# Patient Record
Sex: Female | Born: 1955 | Hispanic: No | Marital: Single | Smoking: Former smoker
Health system: Southern US, Community
[De-identification: ages and names within clinical notes are randomized; demographics above are authoritative.]

## PROBLEM LIST (undated history)

## (undated) DIAGNOSIS — K760 Fatty (change of) liver, not elsewhere classified: Secondary | ICD-10-CM

## (undated) DIAGNOSIS — K5792 Diverticulitis of intestine, part unspecified, without perforation or abscess without bleeding: Secondary | ICD-10-CM

## (undated) DIAGNOSIS — I10 Essential (primary) hypertension: Secondary | ICD-10-CM

## (undated) DIAGNOSIS — IMO0002 Reserved for concepts with insufficient information to code with codable children: Secondary | ICD-10-CM

## (undated) HISTORY — PX: ABDOMINAL HYSTERECTOMY: SHX81

## (undated) HISTORY — PX: ANKLE SURGERY: SHX546

## (undated) HISTORY — PX: APPENDECTOMY: SHX54

---

## 1999-10-07 ENCOUNTER — Inpatient Hospital Stay (HOSPITAL_COMMUNITY): Admission: EM | Admit: 1999-10-07 | Discharge: 1999-10-12 | Payer: Self-pay | Admitting: Emergency Medicine

## 1999-10-07 ENCOUNTER — Encounter: Payer: Self-pay | Admitting: General Surgery

## 1999-10-07 ENCOUNTER — Encounter: Payer: Self-pay | Admitting: Emergency Medicine

## 2001-02-27 ENCOUNTER — Encounter: Admission: RE | Admit: 2001-02-27 | Discharge: 2001-02-27 | Payer: Self-pay | Admitting: Cardiology

## 2001-02-27 ENCOUNTER — Encounter: Payer: Self-pay | Admitting: Cardiology

## 2001-03-19 ENCOUNTER — Encounter: Admission: RE | Admit: 2001-03-19 | Discharge: 2001-04-01 | Payer: Self-pay | Admitting: Family Medicine

## 2002-05-28 ENCOUNTER — Ambulatory Visit (HOSPITAL_COMMUNITY): Admission: RE | Admit: 2002-05-28 | Discharge: 2002-05-28 | Payer: Self-pay | Admitting: Internal Medicine

## 2002-05-28 ENCOUNTER — Encounter: Payer: Self-pay | Admitting: Internal Medicine

## 2003-05-22 ENCOUNTER — Other Ambulatory Visit: Admission: RE | Admit: 2003-05-22 | Discharge: 2003-05-22 | Payer: Self-pay | Admitting: Obstetrics and Gynecology

## 2003-09-01 ENCOUNTER — Encounter: Admission: RE | Admit: 2003-09-01 | Discharge: 2003-09-01 | Payer: Self-pay | Admitting: General Practice

## 2003-09-01 ENCOUNTER — Encounter: Payer: Self-pay | Admitting: General Practice

## 2004-05-05 ENCOUNTER — Emergency Department (HOSPITAL_COMMUNITY): Admission: EM | Admit: 2004-05-05 | Discharge: 2004-05-05 | Payer: Self-pay

## 2004-05-12 ENCOUNTER — Ambulatory Visit (HOSPITAL_COMMUNITY): Admission: RE | Admit: 2004-05-12 | Discharge: 2004-05-12 | Payer: Self-pay | Admitting: Family Medicine

## 2004-06-11 ENCOUNTER — Ambulatory Visit (HOSPITAL_COMMUNITY): Admission: RE | Admit: 2004-06-11 | Discharge: 2004-06-11 | Payer: Self-pay | Admitting: Internal Medicine

## 2005-06-20 ENCOUNTER — Ambulatory Visit: Payer: Self-pay | Admitting: Nurse Practitioner

## 2005-06-21 ENCOUNTER — Ambulatory Visit (HOSPITAL_COMMUNITY): Admission: RE | Admit: 2005-06-21 | Discharge: 2005-06-21 | Payer: Self-pay | Admitting: Internal Medicine

## 2005-06-27 ENCOUNTER — Ambulatory Visit (HOSPITAL_COMMUNITY): Admission: RE | Admit: 2005-06-27 | Discharge: 2005-06-27 | Payer: Self-pay | Admitting: Family Medicine

## 2005-07-05 ENCOUNTER — Ambulatory Visit: Payer: Self-pay | Admitting: Nurse Practitioner

## 2005-07-06 ENCOUNTER — Ambulatory Visit: Payer: Self-pay | Admitting: *Deleted

## 2005-12-07 ENCOUNTER — Ambulatory Visit: Payer: Self-pay | Admitting: Nurse Practitioner

## 2005-12-27 ENCOUNTER — Ambulatory Visit: Payer: Self-pay | Admitting: Nurse Practitioner

## 2005-12-28 ENCOUNTER — Ambulatory Visit (HOSPITAL_COMMUNITY): Admission: RE | Admit: 2005-12-28 | Discharge: 2005-12-28 | Payer: Self-pay | Admitting: Internal Medicine

## 2006-01-04 ENCOUNTER — Ambulatory Visit: Payer: Self-pay | Admitting: Nurse Practitioner

## 2006-03-05 IMAGING — CR DG BE W/ AIR HIGH DENSITY
2 series · 2 of 2 positions shown · non-contrast
Comparison: None.

CLINICAL DATA: Rectal bleeding.
DOUBLE CONTRAST BARIUM ENEMA, 05/12/04

[view not recorded (1 of 2)]
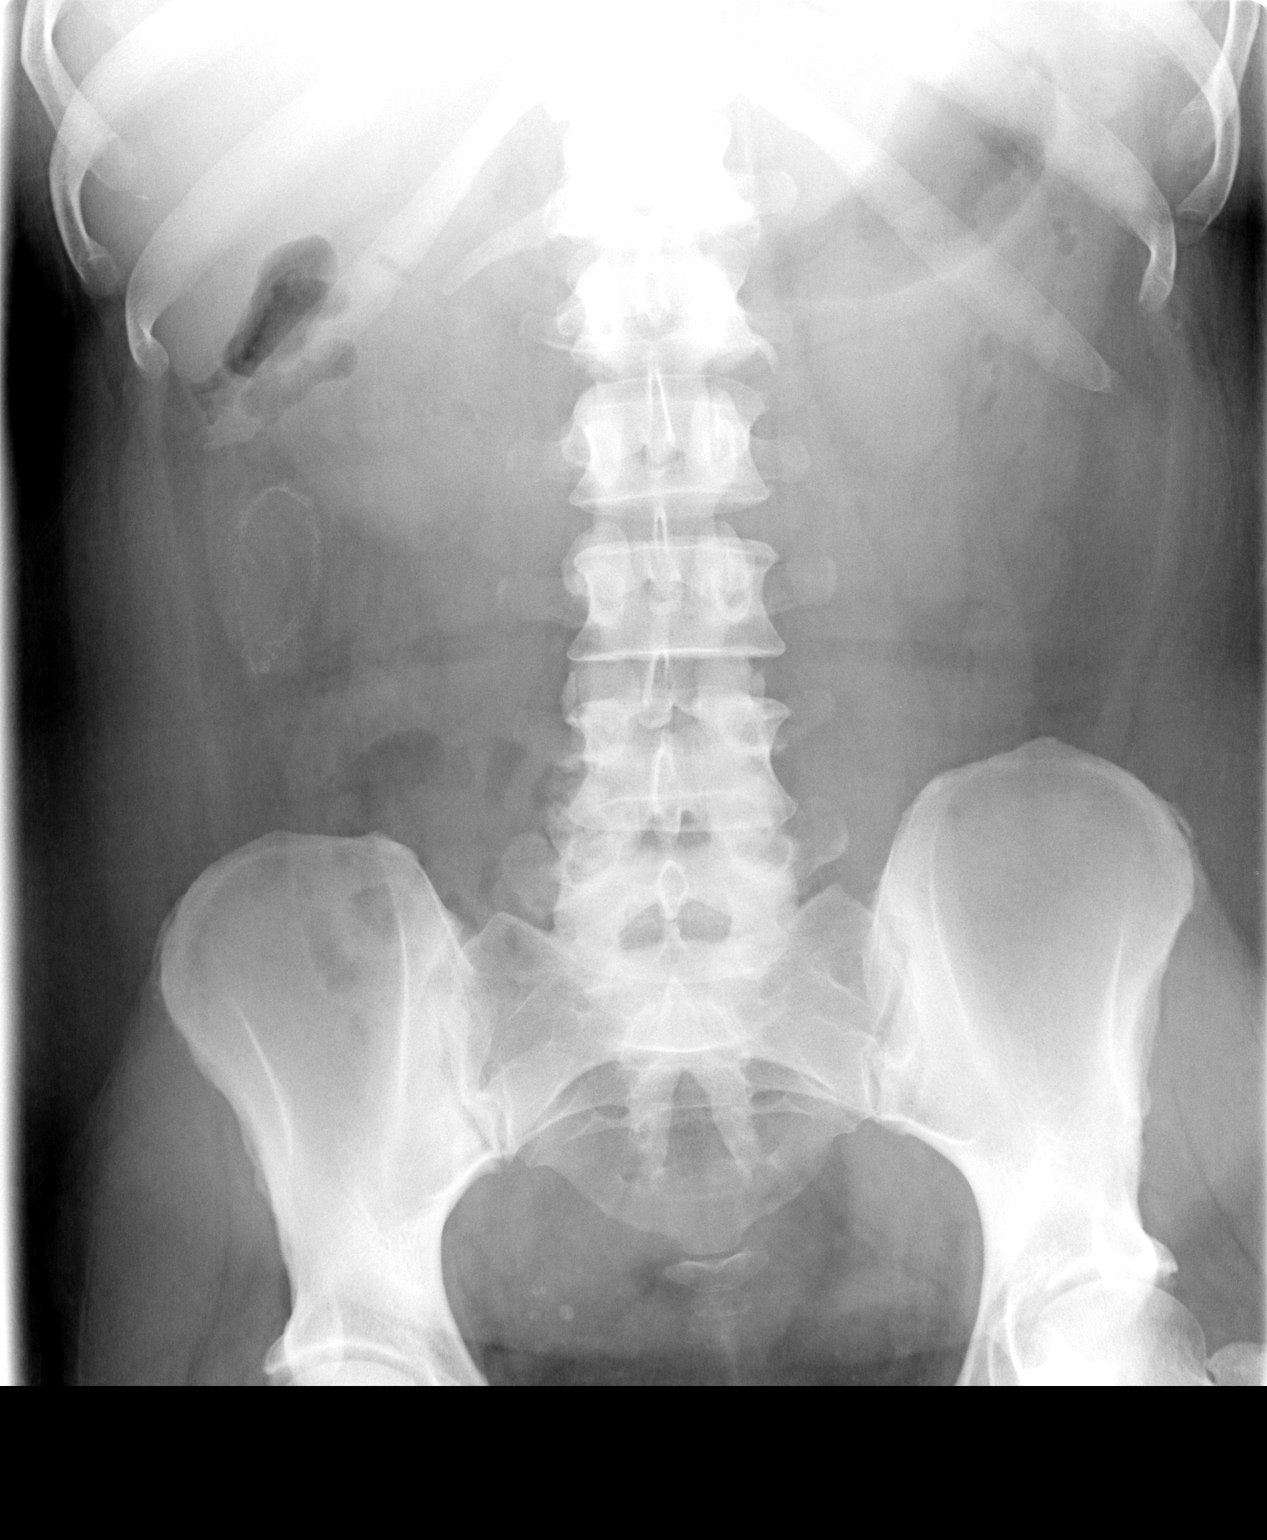

[view not recorded (2 of 2)]
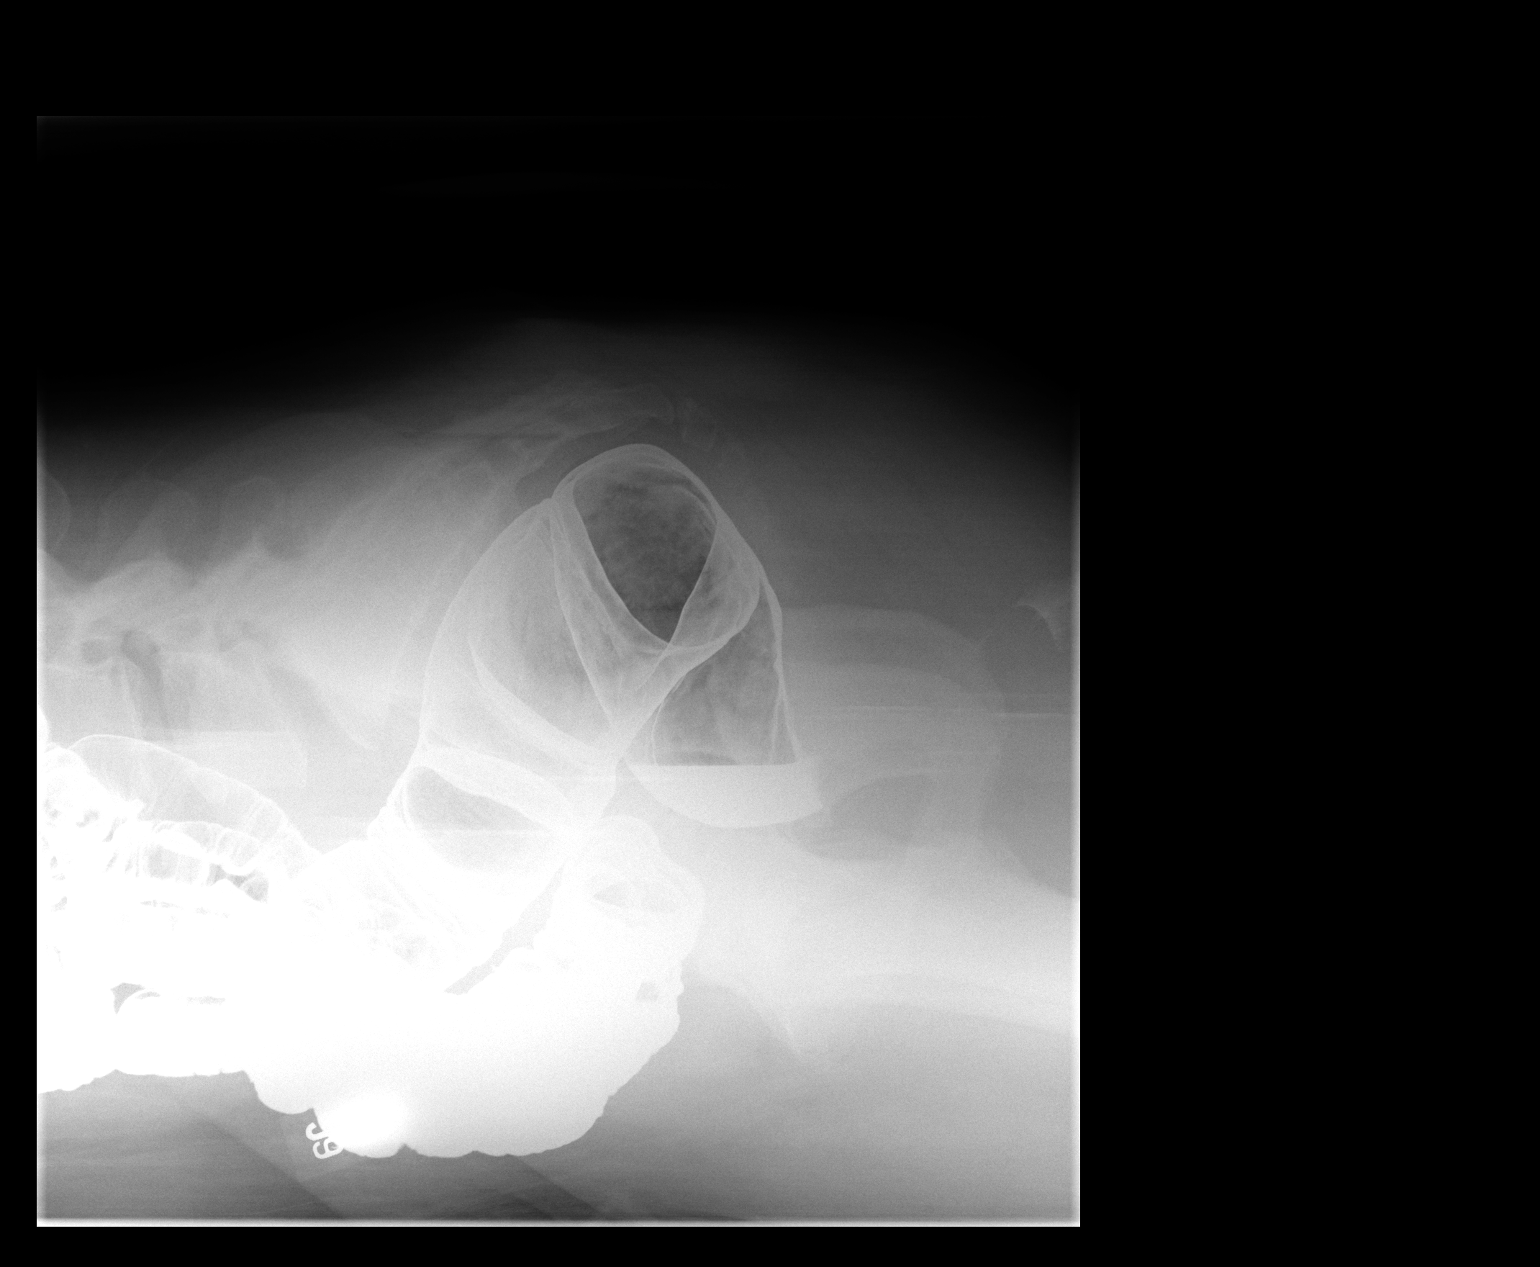

[2 of 2 positions shown; findings below may reference images not displayed]

FINDINGS: Pre-procedure KUB demonstrates minimal bowel gas.  There is a surgical suture line in the right abdomen suggesting bowel anastomosis.  Patient reports only a history of appendectomy, tubal ligation, and hysterectomy.
FINDINGS: Double contrast barium enema was performed.  There was brisk reflux of air and barium into the distal ileum via the right upper quadrant ileocolic anastomosis.  Presumably, the patient?s cecum was removed at the time of appendectomy.  Given the reflux of air into the small bowel, colonic distention was limited.  Additionally, with reflux into the small bowel, the patient became uncomfortable.  Using multiple positions, including prone and supine as well as upright, nearly the entire colon was able to be visualized.  There is no evidence for significant colonic polyp or mass.  No colonic stricture is identified.  A short segment of the distal transverse colon could not be evaluated in double contrast projection secondary to bowel position.  Additionally, there is superimposition in the region of the ileocolic anastomosis and the colon at the level of the anastomosis cannot be well distended.
Diverticular change is seen scattered throughout the course of the colon from the ileocolic anastomosis to the rectum.
IMPRESSION 
Limited study without evidence for significant colonic polyps or masses within the visualized colon.  The distal ascending colon at the level of the ileocolic anastomosis, short segment of the distal transverse colon, and a short segment in the region of the distal descending colon cannot be assessed on double contrast views despite multiple different positions.  
If the patient?s symptoms persist or worsen, colonoscopy may prove helpful to further characterize.

## 2006-04-24 ENCOUNTER — Ambulatory Visit: Payer: Self-pay | Admitting: Nurse Practitioner

## 2006-07-08 ENCOUNTER — Emergency Department (HOSPITAL_COMMUNITY): Admission: EM | Admit: 2006-07-08 | Discharge: 2006-07-08 | Payer: Self-pay | Admitting: *Deleted

## 2006-07-10 ENCOUNTER — Ambulatory Visit: Payer: Self-pay | Admitting: Nurse Practitioner

## 2006-07-27 ENCOUNTER — Ambulatory Visit: Payer: Self-pay | Admitting: Nurse Practitioner

## 2006-07-30 ENCOUNTER — Ambulatory Visit: Payer: Self-pay | Admitting: Internal Medicine

## 2006-07-30 ENCOUNTER — Ambulatory Visit (HOSPITAL_COMMUNITY): Admission: RE | Admit: 2006-07-30 | Discharge: 2006-07-30 | Payer: Self-pay | Admitting: Nurse Practitioner

## 2007-07-04 ENCOUNTER — Ambulatory Visit: Payer: Self-pay | Admitting: Internal Medicine

## 2007-08-21 ENCOUNTER — Encounter (INDEPENDENT_AMBULATORY_CARE_PROVIDER_SITE_OTHER): Payer: Self-pay | Admitting: *Deleted

## 2007-10-15 ENCOUNTER — Emergency Department (HOSPITAL_COMMUNITY): Admission: EM | Admit: 2007-10-15 | Discharge: 2007-10-15 | Payer: Self-pay | Admitting: *Deleted

## 2007-10-23 ENCOUNTER — Ambulatory Visit: Payer: Self-pay | Admitting: Family Medicine

## 2007-10-23 ENCOUNTER — Encounter (INDEPENDENT_AMBULATORY_CARE_PROVIDER_SITE_OTHER): Payer: Self-pay | Admitting: Nurse Practitioner

## 2007-10-23 LAB — CONVERTED CEMR LAB
Basophils Absolute: 0 10*3/uL (ref 0.0–0.1)
Eosinophils Relative: 5 % (ref 0–5)
Hemoglobin: 12.7 g/dL (ref 12.0–15.0)
Hep A Total Ab: NEGATIVE
Hep B Core Total Ab: NEGATIVE
Hep B E Ab: NEGATIVE
Monocytes Relative: 10 % (ref 3–12)
Neutro Abs: 6 10*3/uL (ref 1.7–7.7)
Neutrophils Relative %: 66 % (ref 43–77)
Platelets: 143 10*3/uL — ABNORMAL LOW (ref 150–400)
RBC: 3.82 M/uL — ABNORMAL LOW (ref 3.87–5.11)
Sed Rate: 34 mm/hr — ABNORMAL HIGH (ref 0–22)
Uric Acid, Serum: 5.2 mg/dL (ref 2.4–7.0)
WBC: 9.1 10*3/uL (ref 4.0–10.5)

## 2007-10-25 ENCOUNTER — Ambulatory Visit (HOSPITAL_COMMUNITY): Admission: RE | Admit: 2007-10-25 | Discharge: 2007-10-25 | Payer: Self-pay | Admitting: Family Medicine

## 2007-10-29 ENCOUNTER — Ambulatory Visit: Payer: Self-pay | Admitting: Family Medicine

## 2007-10-29 ENCOUNTER — Encounter (INDEPENDENT_AMBULATORY_CARE_PROVIDER_SITE_OTHER): Payer: Self-pay | Admitting: Nurse Practitioner

## 2007-10-29 LAB — CONVERTED CEMR LAB
LDL Cholesterol: 113 mg/dL — ABNORMAL HIGH (ref 0–99)
Triglycerides: 72 mg/dL (ref <150)
VLDL: 14 mg/dL (ref 0–40)

## 2007-11-07 ENCOUNTER — Ambulatory Visit: Payer: Self-pay | Admitting: Family Medicine

## 2007-12-03 ENCOUNTER — Ambulatory Visit (HOSPITAL_COMMUNITY): Admission: RE | Admit: 2007-12-03 | Discharge: 2007-12-03 | Payer: Self-pay | Admitting: Family Medicine

## 2008-02-20 ENCOUNTER — Ambulatory Visit: Payer: Self-pay | Admitting: Internal Medicine

## 2008-05-04 ENCOUNTER — Emergency Department (HOSPITAL_COMMUNITY): Admission: EM | Admit: 2008-05-04 | Discharge: 2008-05-04 | Payer: Self-pay | Admitting: Emergency Medicine

## 2008-08-05 ENCOUNTER — Emergency Department (HOSPITAL_COMMUNITY): Admission: EM | Admit: 2008-08-05 | Discharge: 2008-08-05 | Payer: Self-pay | Admitting: Emergency Medicine

## 2008-08-21 ENCOUNTER — Ambulatory Visit: Payer: Self-pay | Admitting: Internal Medicine

## 2008-10-13 ENCOUNTER — Emergency Department (HOSPITAL_COMMUNITY): Admission: EM | Admit: 2008-10-13 | Discharge: 2008-10-13 | Payer: Self-pay | Admitting: Emergency Medicine

## 2009-04-09 ENCOUNTER — Ambulatory Visit: Payer: Self-pay | Admitting: Family Medicine

## 2009-04-16 ENCOUNTER — Ambulatory Visit: Payer: Self-pay | Admitting: Internal Medicine

## 2009-04-21 ENCOUNTER — Emergency Department (HOSPITAL_COMMUNITY): Admission: EM | Admit: 2009-04-21 | Discharge: 2009-04-22 | Payer: Self-pay | Admitting: Emergency Medicine

## 2009-04-22 ENCOUNTER — Ambulatory Visit: Payer: Self-pay | Admitting: Family Medicine

## 2009-07-07 ENCOUNTER — Ambulatory Visit: Payer: Self-pay | Admitting: Internal Medicine

## 2009-08-11 ENCOUNTER — Ambulatory Visit: Payer: Self-pay | Admitting: Adult Health

## 2009-08-12 ENCOUNTER — Encounter (INDEPENDENT_AMBULATORY_CARE_PROVIDER_SITE_OTHER): Payer: Self-pay | Admitting: Nurse Practitioner

## 2009-08-13 ENCOUNTER — Ambulatory Visit (HOSPITAL_COMMUNITY): Admission: RE | Admit: 2009-08-13 | Discharge: 2009-08-13 | Payer: Self-pay | Admitting: Internal Medicine

## 2009-08-17 IMAGING — CR DG ANKLE COMPLETE 3+V*L*
3 series · 3 of 3 positions shown · non-contrast
Comparison: none

CLINICAL DATA: Left ankle pain. Previous injury.
 LEFT ANKLE ?3 VIEW:

[t ankle joint ap left]
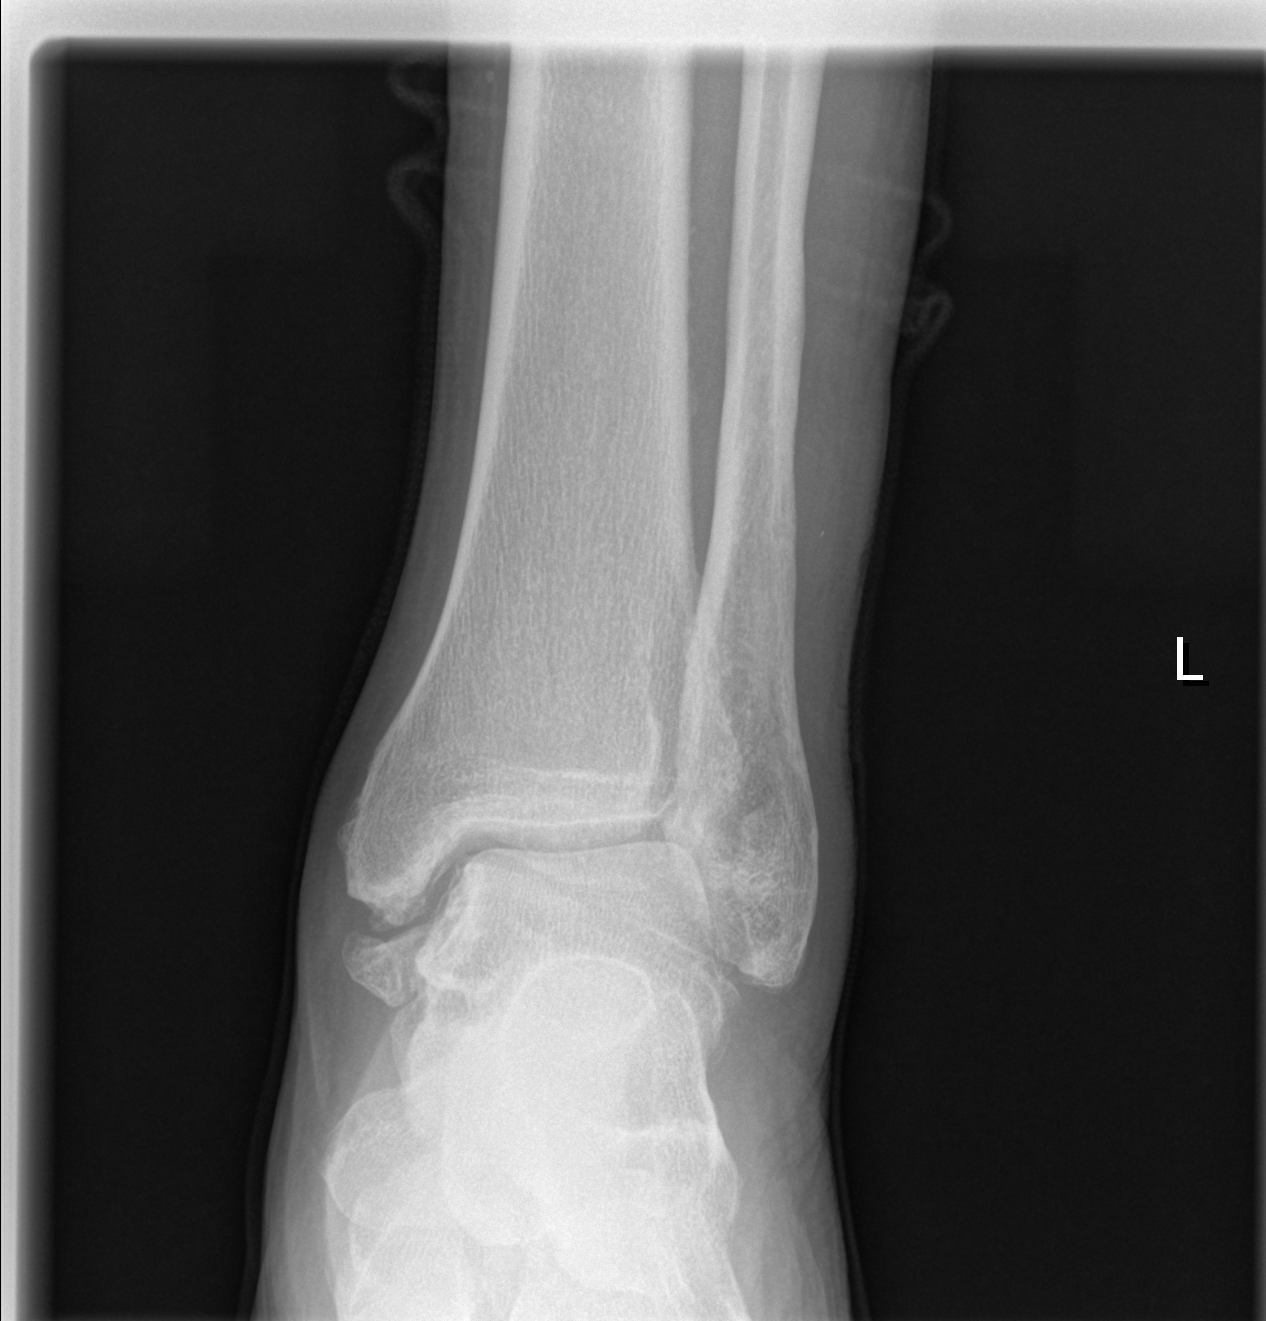

[t ankle joint oblique left]
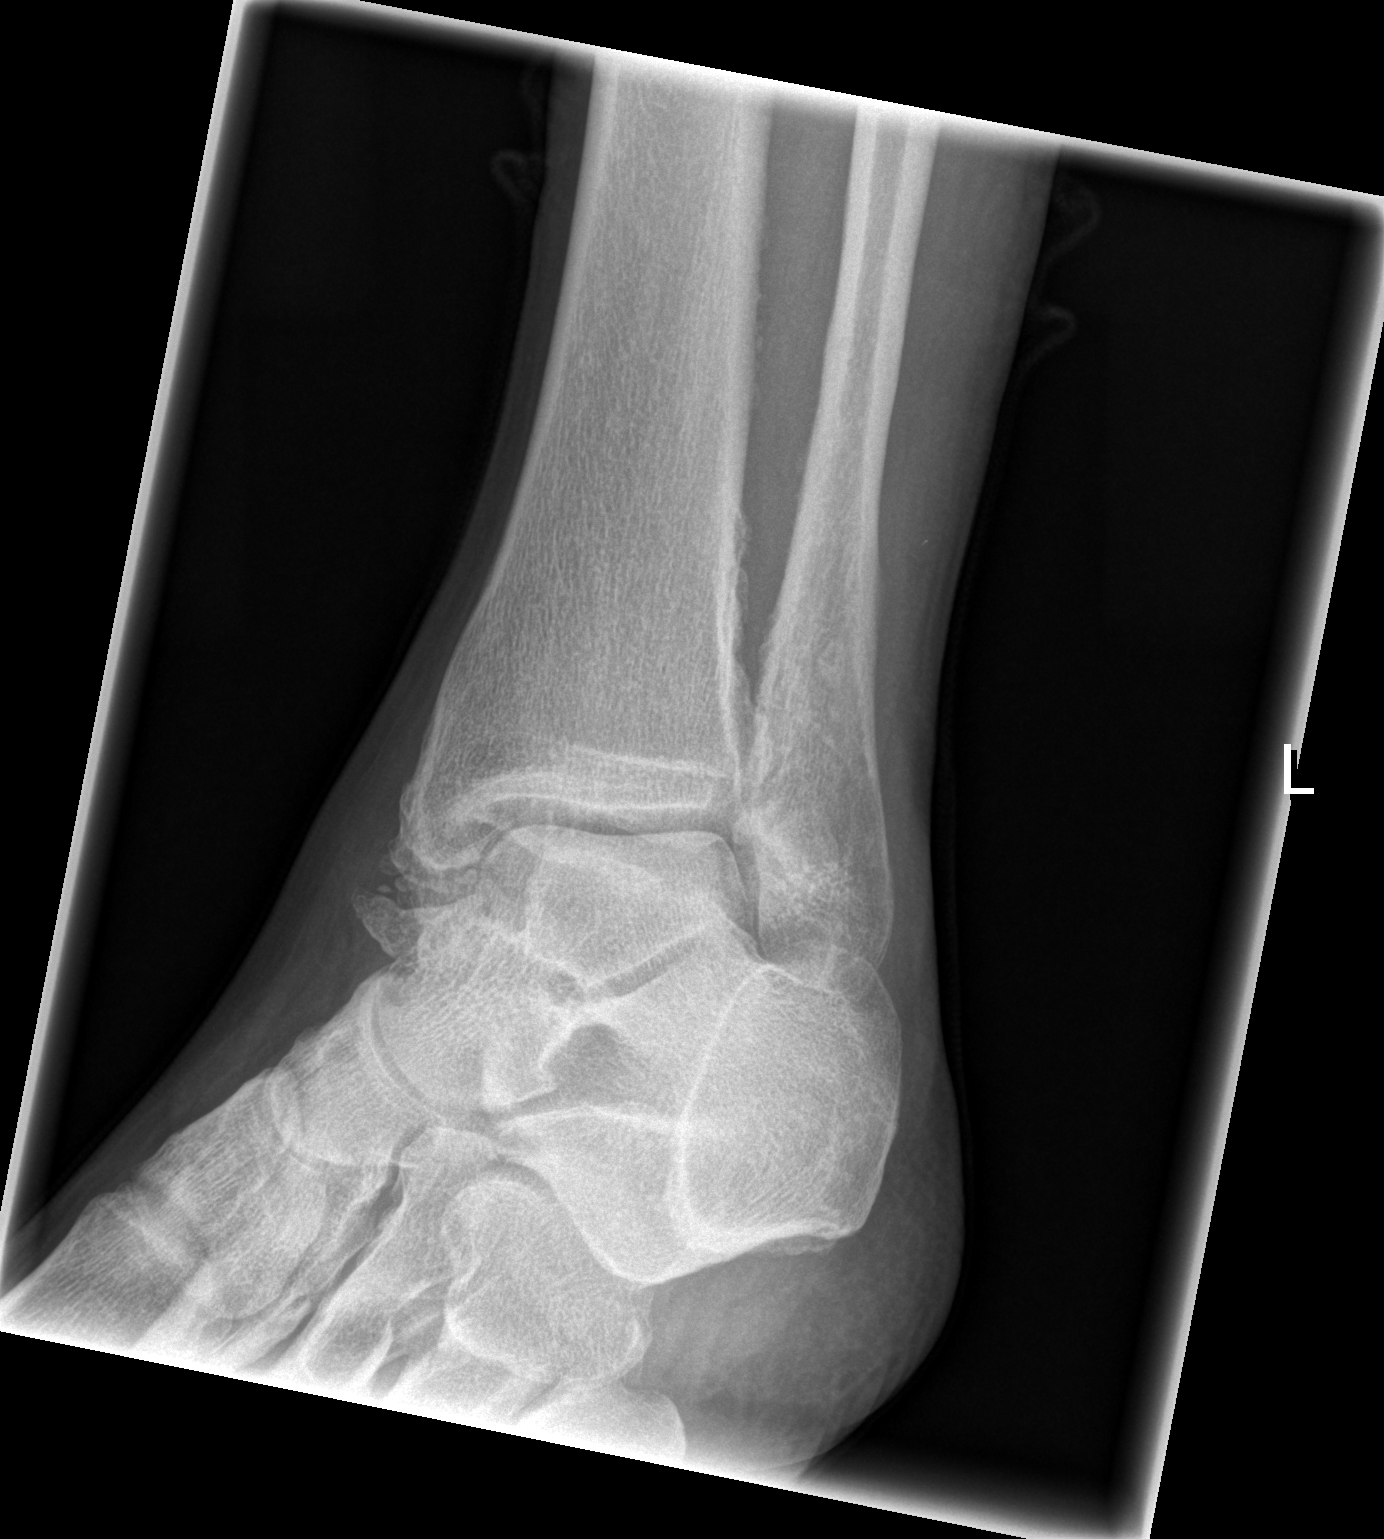

[t ankle joint lat left]
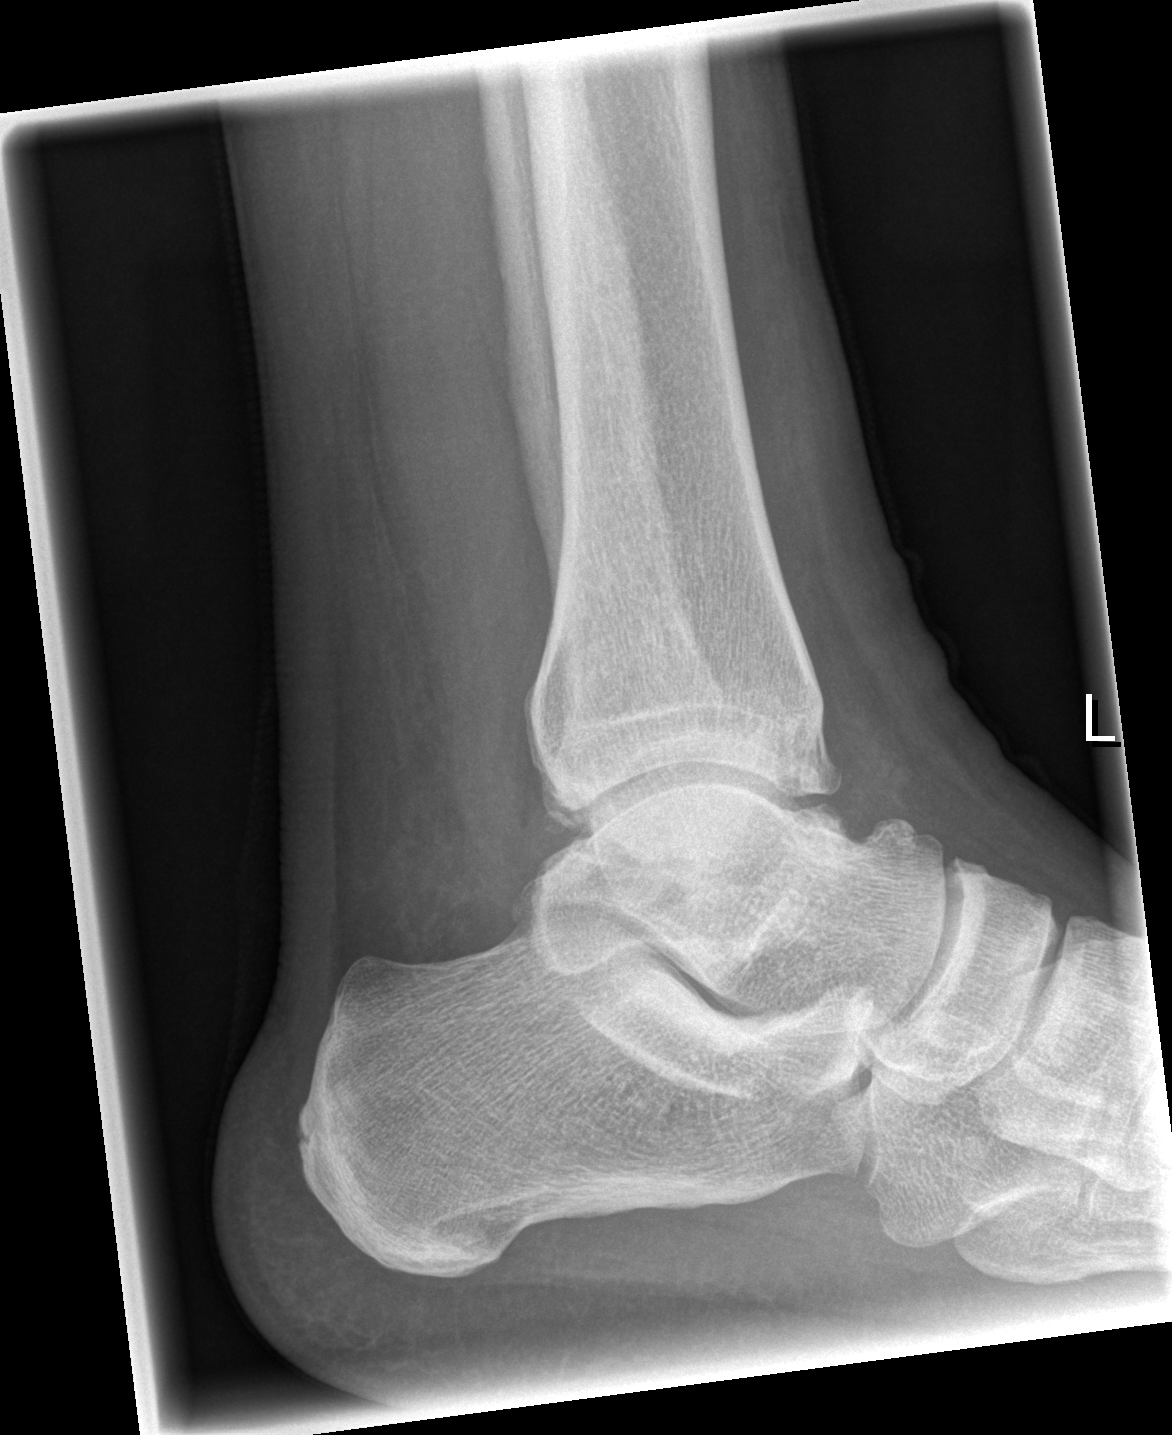

[3 of 3 positions shown; findings below may reference images not displayed]

FINDINGS: There is degenerative arthritis at the ankle joint with tibial and talar osteophytes.  Joint space is not narrowed.  There is an old medial malleolar fracture with some ossification in the region of the deltoid ligament.  I wonder if there could be calcaneonavicular tarsal coalition.  No acute finding is seen.
IMPRESSION: Evidence      of an old medial malleolar fracture/deltoid ligament injury.
  Degenerative      changes at the ankle joint.
  Possible      calcaneonavicular coalition.

## 2009-09-30 ENCOUNTER — Ambulatory Visit: Payer: Self-pay | Admitting: Family Medicine

## 2009-09-30 ENCOUNTER — Encounter (INDEPENDENT_AMBULATORY_CARE_PROVIDER_SITE_OTHER): Payer: Self-pay | Admitting: Internal Medicine

## 2009-11-03 ENCOUNTER — Telehealth (INDEPENDENT_AMBULATORY_CARE_PROVIDER_SITE_OTHER): Payer: Self-pay | Admitting: *Deleted

## 2009-11-03 ENCOUNTER — Emergency Department (HOSPITAL_COMMUNITY): Admission: EM | Admit: 2009-11-03 | Discharge: 2009-11-03 | Payer: Self-pay | Admitting: Family Medicine

## 2009-11-05 ENCOUNTER — Ambulatory Visit: Payer: Self-pay | Admitting: Internal Medicine

## 2010-01-05 ENCOUNTER — Encounter: Admission: RE | Admit: 2010-01-05 | Discharge: 2010-01-05 | Payer: Self-pay | Admitting: Sports Medicine

## 2010-01-14 ENCOUNTER — Inpatient Hospital Stay (HOSPITAL_COMMUNITY): Admission: EM | Admit: 2010-01-14 | Discharge: 2010-01-15 | Payer: Self-pay | Admitting: Emergency Medicine

## 2010-01-25 ENCOUNTER — Ambulatory Visit: Payer: Self-pay | Admitting: Thoracic Surgery

## 2010-02-09 ENCOUNTER — Ambulatory Visit: Payer: Self-pay | Admitting: Internal Medicine

## 2010-02-10 ENCOUNTER — Encounter (INDEPENDENT_AMBULATORY_CARE_PROVIDER_SITE_OTHER): Payer: Self-pay | Admitting: Adult Health

## 2010-02-15 ENCOUNTER — Ambulatory Visit (HOSPITAL_COMMUNITY): Admission: RE | Admit: 2010-02-15 | Discharge: 2010-02-15 | Payer: Self-pay | Admitting: Internal Medicine

## 2010-03-21 ENCOUNTER — Ambulatory Visit: Payer: Self-pay | Admitting: Family Medicine

## 2010-03-21 ENCOUNTER — Encounter (INDEPENDENT_AMBULATORY_CARE_PROVIDER_SITE_OTHER): Payer: Self-pay | Admitting: Adult Health

## 2010-03-21 LAB — CONVERTED CEMR LAB: Microalb, Ur: 0.51 mg/dL (ref 0.00–1.89)

## 2010-06-29 ENCOUNTER — Ambulatory Visit: Payer: Self-pay | Admitting: Internal Medicine

## 2010-06-29 ENCOUNTER — Encounter (INDEPENDENT_AMBULATORY_CARE_PROVIDER_SITE_OTHER): Payer: Self-pay | Admitting: Adult Health

## 2010-06-29 LAB — CONVERTED CEMR LAB
ALT: 97 units/L — ABNORMAL HIGH (ref 0–35)
AST: 127 units/L — ABNORMAL HIGH (ref 0–37)
BUN: 13 mg/dL (ref 6–23)
Basophils Absolute: 0.1 10*3/uL (ref 0.0–0.1)
Basophils Relative: 1 % (ref 0–1)
CO2: 19 meq/L (ref 19–32)
Glucose, Bld: 77 mg/dL (ref 70–99)
Lymphs Abs: 2.4 10*3/uL (ref 0.7–4.0)
MCV: 92 fL (ref 78.0–100.0)
Monocytes Relative: 7 % (ref 3–12)
Neutro Abs: 2 10*3/uL (ref 1.7–7.7)
Neutrophils Relative %: 40 % — ABNORMAL LOW (ref 43–77)
Platelets: 217 10*3/uL (ref 150–400)
Potassium: 3.9 meq/L (ref 3.5–5.3)
RBC: 4.4 M/uL (ref 3.87–5.11)
RDW: 15.2 % (ref 11.5–15.5)
Total Protein: 8 g/dL (ref 6.0–8.3)

## 2010-06-30 ENCOUNTER — Encounter (INDEPENDENT_AMBULATORY_CARE_PROVIDER_SITE_OTHER): Payer: Self-pay | Admitting: Adult Health

## 2010-06-30 LAB — CONVERTED CEMR LAB
HCV Ab: NEGATIVE
Hep B Core Total Ab: NEGATIVE

## 2010-08-02 ENCOUNTER — Ambulatory Visit: Payer: Self-pay | Admitting: Internal Medicine

## 2010-08-02 ENCOUNTER — Encounter (INDEPENDENT_AMBULATORY_CARE_PROVIDER_SITE_OTHER): Payer: Self-pay | Admitting: Family Medicine

## 2010-08-02 LAB — CONVERTED CEMR LAB
AST: 70 units/L — ABNORMAL HIGH (ref 0–37)
Albumin: 4.4 g/dL (ref 3.5–5.2)
Bilirubin, Direct: 0.1 mg/dL (ref 0.0–0.3)
HDL: 40 mg/dL (ref >39)
LDL Cholesterol: 141 mg/dL — ABNORMAL HIGH (ref 0–99)
Total Bilirubin: 0.6 mg/dL (ref 0.3–1.2)
Total CHOL/HDL Ratio: 5.2

## 2010-11-16 ENCOUNTER — Encounter (INDEPENDENT_AMBULATORY_CARE_PROVIDER_SITE_OTHER): Payer: Self-pay | Admitting: *Deleted

## 2010-11-16 LAB — CONVERTED CEMR LAB
ALT: 60 units/L — ABNORMAL HIGH (ref 0–35)
Albumin: 4.7 g/dL (ref 3.5–5.2)
CO2: 28 meq/L (ref 19–32)
Calcium: 10.2 mg/dL (ref 8.4–10.5)
Chloride: 99 meq/L (ref 96–112)
Glucose, Bld: 102 mg/dL — ABNORMAL HIGH (ref 70–99)
Potassium: 4.5 meq/L (ref 3.5–5.3)
Sodium: 137 meq/L (ref 135–145)
Total Protein: 8.3 g/dL (ref 6.0–8.3)

## 2010-12-02 ENCOUNTER — Ambulatory Visit (HOSPITAL_COMMUNITY)
Admission: RE | Admit: 2010-12-02 | Discharge: 2010-12-02 | Payer: Self-pay | Source: Home / Self Care | Attending: Family Medicine | Admitting: Family Medicine

## 2010-12-25 ENCOUNTER — Encounter: Payer: Self-pay | Admitting: Family Medicine

## 2011-01-05 ENCOUNTER — Emergency Department (HOSPITAL_COMMUNITY)
Admission: EM | Admit: 2011-01-05 | Discharge: 2011-01-05 | Disposition: A | Discharge status: Home / Self Care | Payer: Medicare Other | Attending: Emergency Medicine | Admitting: Emergency Medicine

## 2011-01-05 DIAGNOSIS — Z79899 Other long term (current) drug therapy: Secondary | ICD-10-CM | POA: Insufficient documentation

## 2011-01-05 DIAGNOSIS — IMO0002 Reserved for concepts with insufficient information to code with codable children: Secondary | ICD-10-CM | POA: Insufficient documentation

## 2011-01-05 DIAGNOSIS — F3289 Other specified depressive episodes: Secondary | ICD-10-CM | POA: Insufficient documentation

## 2011-01-05 DIAGNOSIS — F329 Major depressive disorder, single episode, unspecified: Secondary | ICD-10-CM | POA: Insufficient documentation

## 2011-01-05 DIAGNOSIS — I10 Essential (primary) hypertension: Secondary | ICD-10-CM | POA: Insufficient documentation

## 2011-01-05 DIAGNOSIS — F17200 Nicotine dependence, unspecified, uncomplicated: Secondary | ICD-10-CM | POA: Insufficient documentation

## 2011-01-05 DIAGNOSIS — K219 Gastro-esophageal reflux disease without esophagitis: Secondary | ICD-10-CM | POA: Insufficient documentation

## 2011-01-24 ENCOUNTER — Other Ambulatory Visit (HOSPITAL_COMMUNITY): Payer: Self-pay | Admitting: Family Medicine

## 2011-01-24 DIAGNOSIS — Z1231 Encounter for screening mammogram for malignant neoplasm of breast: Secondary | ICD-10-CM

## 2011-02-13 IMAGING — CR DG CHEST 1V PORT
1 series · 1 of 1 positions shown · non-contrast
Comparison: 10/13/2008

CLINICAL DATA: Chest pain and shortness of breath.

PORTABLE CHEST - 1 VIEW

[AP]
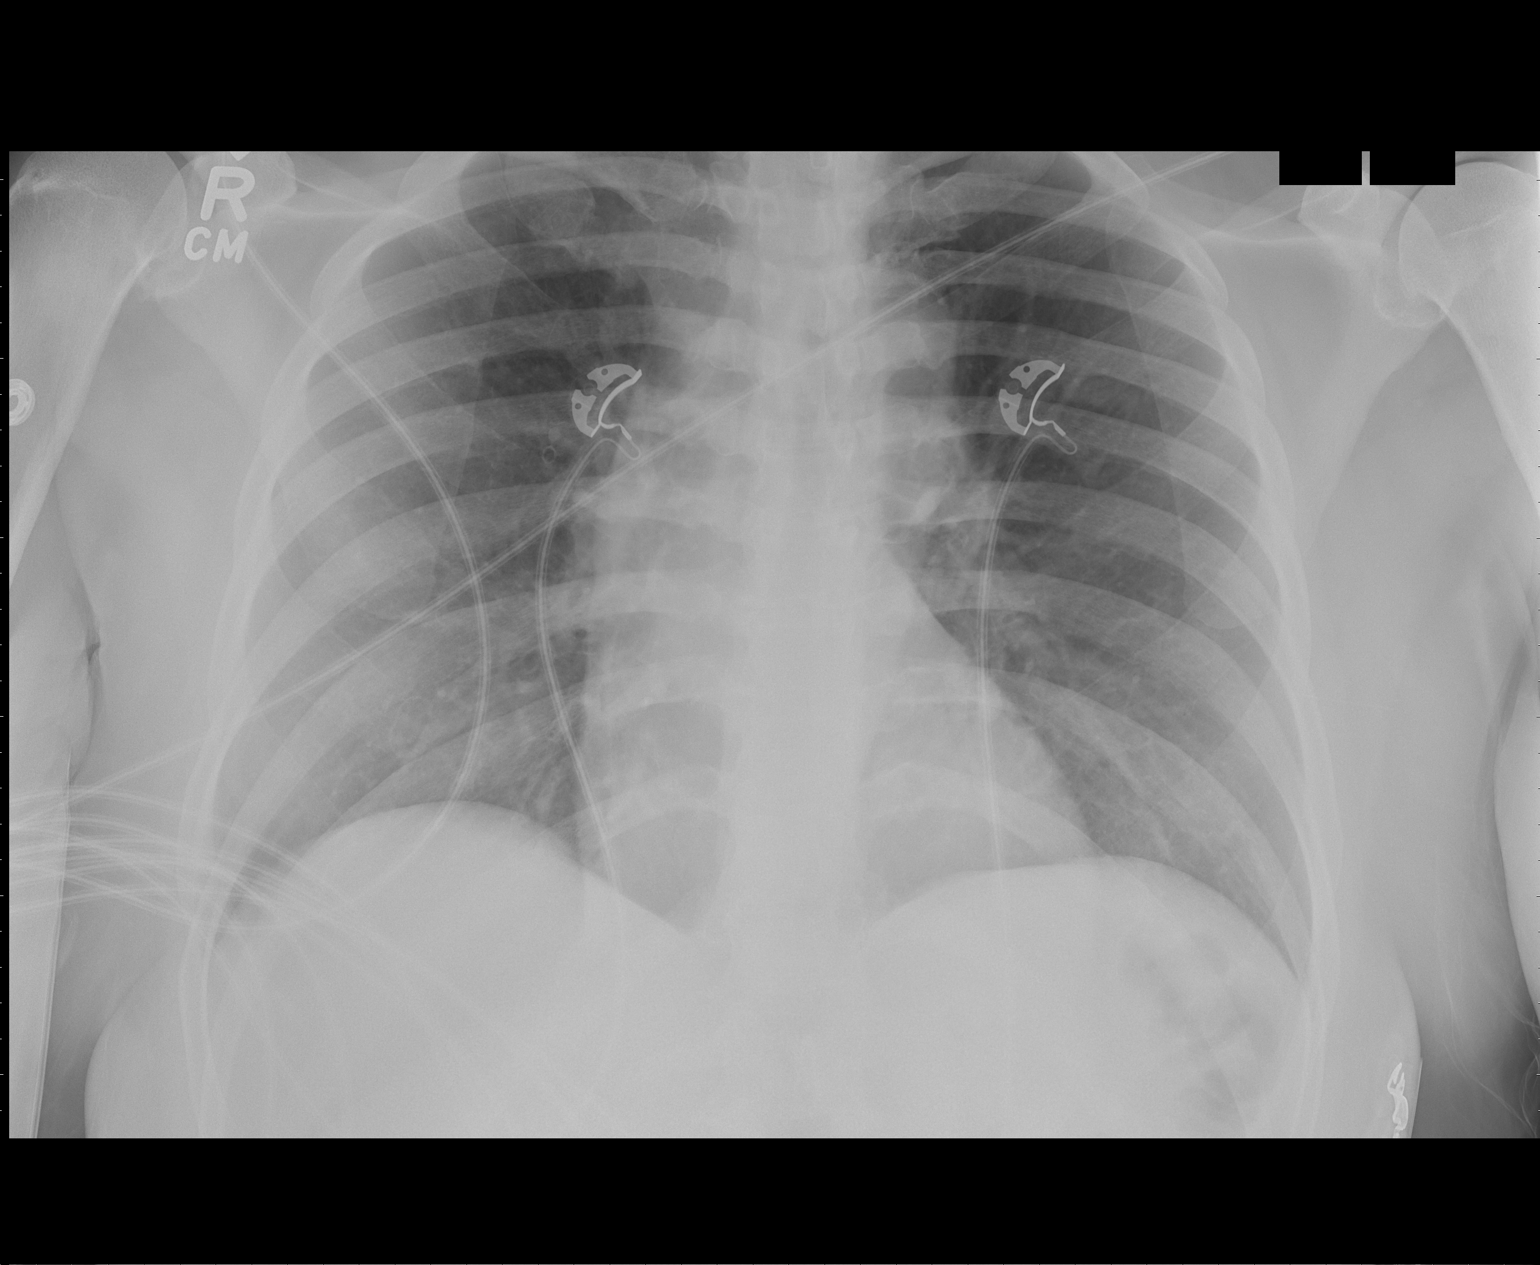

[1 of 1 positions shown; findings below may reference images not displayed]

FINDINGS: The heart size is stable and within normal limits.  No
edema or infiltrates.  No visualized pleural effusions.
IMPRESSION: No active disease.

## 2011-02-17 ENCOUNTER — Ambulatory Visit (HOSPITAL_COMMUNITY): Payer: Medicare Other | Attending: Family Medicine

## 2011-02-22 LAB — URINE CULTURE
Colony Count: 40000
Culture: NO GROWTH

## 2011-02-22 LAB — BASIC METABOLIC PANEL
Calcium: 7.8 mg/dL — ABNORMAL LOW (ref 8.4–10.5)
Creatinine, Ser: 0.71 mg/dL (ref 0.4–1.2)
GFR calc Af Amer: >60 mL/min (ref >60)
GFR calc non Af Amer: >60 mL/min (ref >60)
Glucose, Bld: 89 mg/dL (ref 70–99)
Sodium: 137 mEq/L (ref 135–145)

## 2011-02-22 LAB — COMPREHENSIVE METABOLIC PANEL
Alkaline Phosphatase: 55 U/L (ref 39–117)
BUN: 8 mg/dL (ref 6–23)
Chloride: 104 mEq/L (ref 96–112)
Glucose, Bld: 144 mg/dL — ABNORMAL HIGH (ref 70–99)
Potassium: 3.6 mEq/L (ref 3.5–5.1)
Total Bilirubin: 0.4 mg/dL (ref 0.3–1.2)

## 2011-02-22 LAB — CBC
HCT: 29.8 % — ABNORMAL LOW (ref 36.0–46.0)
Hemoglobin: 10.2 g/dL — ABNORMAL LOW (ref 12.0–15.0)
Hemoglobin: 10.4 g/dL — ABNORMAL LOW (ref 12.0–15.0)
RBC: 2.97 MIL/uL — ABNORMAL LOW (ref 3.87–5.11)
RBC: 3.02 MIL/uL — ABNORMAL LOW (ref 3.87–5.11)
RBC: 3.65 MIL/uL — ABNORMAL LOW (ref 3.87–5.11)
RDW: 15.7 % — ABNORMAL HIGH (ref 11.5–15.5)
WBC: 12.5 10*3/uL — ABNORMAL HIGH (ref 4.0–10.5)
WBC: 13.3 10*3/uL — ABNORMAL HIGH (ref 4.0–10.5)

## 2011-02-22 LAB — URINALYSIS, ROUTINE W REFLEX MICROSCOPIC
Glucose, UA: NEGATIVE mg/dL
Ketones, ur: 15 mg/dL — AB
Nitrite: NEGATIVE
Nitrite: NEGATIVE
Protein, ur: 100 mg/dL — AB
Specific Gravity, Urine: 1.03 (ref 1.005–1.030)
Urobilinogen, UA: 0.2 mg/dL (ref 0.0–1.0)
Urobilinogen, UA: 0.2 mg/dL (ref 0.0–1.0)

## 2011-02-22 LAB — POCT CARDIAC MARKERS
CKMB, poc: 1.5 ng/mL (ref 1.0–8.0)
CKMB, poc: 1.7 ng/mL (ref 1.0–8.0)
Myoglobin, poc: 101 ng/mL (ref 12–200)
Troponin i, poc: <0.05 ng/mL (ref 0.00–0.09)

## 2011-02-22 LAB — DIFFERENTIAL
Lymphocytes Relative: 15 % (ref 12–46)
Lymphs Abs: 2 10*3/uL (ref 0.7–4.0)
Monocytes Relative: 9 % (ref 3–12)
Neutro Abs: 10.1 10*3/uL — ABNORMAL HIGH (ref 1.7–7.7)
Neutrophils Relative %: 76 % (ref 43–77)

## 2011-02-22 LAB — POCT I-STAT, CHEM 8
BUN: 14 mg/dL (ref 6–23)
Chloride: 101 mEq/L (ref 96–112)
Creatinine, Ser: 1.4 mg/dL — ABNORMAL HIGH (ref 0.4–1.2)
Glucose, Bld: 119 mg/dL — ABNORMAL HIGH (ref 70–99)
HCT: 39 % (ref 36.0–46.0)
Potassium: 3.4 mEq/L — ABNORMAL LOW (ref 3.5–5.1)

## 2011-02-22 LAB — URINE MICROSCOPIC-ADD ON

## 2011-02-22 LAB — MAGNESIUM: Magnesium: 1.5 mg/dL (ref 1.5–2.5)

## 2011-02-22 LAB — CULTURE, BLOOD (ROUTINE X 2)

## 2011-03-07 LAB — POCT URINALYSIS DIP (DEVICE)
Glucose, UA: NEGATIVE mg/dL
pH: 5.5 (ref 5.0–8.0)

## 2011-03-14 LAB — POCT I-STAT, CHEM 8
BUN: 4 mg/dL — ABNORMAL LOW (ref 6–23)
Calcium, Ion: 1.17 mmol/L (ref 1.12–1.32)
Chloride: 106 mEq/L (ref 96–112)
Creatinine, Ser: 0.8 mg/dL (ref 0.4–1.2)
Glucose, Bld: 88 mg/dL (ref 70–99)
Potassium: 3.4 mEq/L — ABNORMAL LOW (ref 3.5–5.1)

## 2011-03-14 LAB — RAPID URINE DRUG SCREEN, HOSP PERFORMED
Barbiturates: NOT DETECTED
Opiates: NOT DETECTED

## 2011-03-14 LAB — TROPONIN I: Troponin I: 0.01 ng/mL (ref 0.00–0.06)

## 2011-03-14 LAB — POCT CARDIAC MARKERS
CKMB, poc: <1 ng/mL — ABNORMAL LOW (ref 1.0–8.0)
Myoglobin, poc: 36.6 ng/mL (ref 12–200)
Troponin i, poc: <0.05 ng/mL (ref 0.00–0.09)

## 2011-03-14 LAB — CK TOTAL AND CKMB (NOT AT ARMC): Relative Index: 1.1 (ref 0.0–2.5)

## 2011-03-14 LAB — CBC
MCHC: 33.8 g/dL (ref 30.0–36.0)
MCV: 96.5 fL (ref 78.0–100.0)
Platelets: 172 10*3/uL (ref 150–400)
RBC: 4.45 MIL/uL (ref 3.87–5.11)
RDW: 15.3 % (ref 11.5–15.5)

## 2011-03-28 ENCOUNTER — Ambulatory Visit (HOSPITAL_COMMUNITY)
Admission: RE | Admit: 2011-03-28 | Discharge: 2011-03-28 | Disposition: A | Discharge status: Home / Self Care | Payer: Medicare Other | Source: Ambulatory Visit | Attending: Family Medicine | Admitting: Family Medicine

## 2011-03-28 DIAGNOSIS — Z1231 Encounter for screening mammogram for malignant neoplasm of breast: Secondary | ICD-10-CM | POA: Insufficient documentation
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
## 2011-04-21 NOTE — Op Note (Signed)
[place]  Patient:    Candice Roy                       MRN: [id number] Proc. Date: 10/07/99 Adm. Date:  [id number] Attending:  Abigail Miyamoto A                           Operative Report  PREOPERATIVE DIAGNOSIS:  Probable appendicitis.  POSTOPERATIVE DIAGNOSIS:  Cecal diverticulitis.  OPERATION PERFORMED:  Partial colectomy with anastomosis.  SURGEON:  Lorne Skeens. Hoxworth, M.D.  ASSISTANT:  Currie Paris, M.D.  ANESTHESIA:  General.  BRIEF HISTORY:  Neftaly Inzunza is a 55 year old black female who presents with two days of progressive right lower quadrant abdominal pain.  She has point tenderness and rebound tenderness in the right lower quadrant.  A CT scan has been obtained which revealed a phlegmon or inflammatory process posterior to the cecum, consistent with a chronic appendicitis.  Laparotomy for probable appendicitis has been recommended and accepted.  Alternative treatment, alternative diagnoses and risks of bleeding and infection were discussed and understood preoperatively. he is now brought to the operating room for this procedure.  DESCRIPTION OF PROCEDURE:  The patient was brought to the operating room and placed in supine position on the operating table and general endotracheal was induced.  Broad spectrum antibiotics had been given intravenously. PAS were in place. The abdomen was sterilely prepped and draped.  An oblique incision was made in the right lower quadrant and dissection carried down through the subcutaneous tissues using cautery.  The external oblique was divided along the lines of its fibers.  The internal oblique was bluntly split and the transversus and peritoneum elevated and sharply entered under direct vision.  There was a small to moderate amount f clear fluid in the right lower quadrant.  The cecum was exposed.  The appendix as identified and was normal.  There was a firm mass  of inflammatory process involving the posterior wall of the cecum.  The cecum was mobilized further dividing some  lateral peritoneal attachments and there was seen to be an edematous firm mass r area of inflammation in the posterior wall of the cecum, grossly consistent with a cecal diverticulitis.  I elected to proceed with resection of the cecum.  The incision was extended somewhat laterally and the internal oblique divided with he cautery laterally.  This allowed good exposure of the proximal right colon and cecum which were mobilized dividing lateral peritoneal attachments until the majority of the right colon and the terminal ileum and cecum could be mobilized up into the wound.  Points for resection at the terminal ileum near the ileocecal valve and the proximal to midright colon were chosen and these areas were cleaned of mesentery and pericolic fat.  The mesentery of the segment to be resected was then sequentially divided with the clamps and tied with 2-0 silk ties, doubly tying larger vessels.  Following this, a functional end-to-end anastomosis was created between the terminal ileum and the right colon.  The staple line was inspected or hemostasis which appeared complete and following this, the specimen was removed and the enterotomy closed with a single firing of the TA-60 stapler.  At this point  gloves and instruments were changed.  The abdomen was irrigated with saline and  inspected for hemostasis which appeared complete.  The mesentery was closed with interrupted 3-0 silk sutures.  Anastomosis was seen  to be widely patent and under good blood supply and no tension.  The viscera were returned to their anatomic position.  The wound was closed in layers with running 0 PDS.  The soft tissue as irrigated with antibiotic solution and the skin closed with staples.  Sponge, needle and instrument counts were correct.  Dry sterile dressing was applied. he patient was  taken to recovery in good condition. DD:  10/07/99 TD:  10/10/99 Job: [id number]

## 2011-08-31 LAB — POCT CARDIAC MARKERS
Operator id: 277751
Troponin i, poc: <0.05

## 2011-08-31 LAB — POCT I-STAT, CHEM 8
BUN: 6
Chloride: 105
Creatinine, Ser: 1
Sodium: 144
TCO2: 28

## 2011-09-05 LAB — CBC
HCT: 42.1
Hemoglobin: 14
MCHC: 33.1
MCV: 98.5
RBC: 4.28
WBC: 9.2

## 2011-09-05 LAB — COMPREHENSIVE METABOLIC PANEL
AST: 72 — ABNORMAL HIGH
BUN: 9
CO2: 29
Calcium: 9.9
Chloride: 104
Creatinine, Ser: 0.7
GFR calc non Af Amer: >60
Glucose, Bld: 90
Total Bilirubin: 1.3 — ABNORMAL HIGH

## 2011-09-05 LAB — DIFFERENTIAL
Basophils Absolute: 0
Eosinophils Relative: 3
Lymphocytes Relative: 19
Lymphs Abs: 1.7
Neutro Abs: 6.6
Neutrophils Relative %: 72

## 2011-09-05 LAB — LIPASE, BLOOD: Lipase: 32

## 2011-09-06 LAB — DIFFERENTIAL
Basophils Absolute: 0.1
Lymphocytes Relative: 15
Lymphs Abs: 1.4
Monocytes Absolute: 0.6
Neutro Abs: 6.9

## 2011-09-06 LAB — POCT I-STAT, CHEM 8
BUN: 7
Chloride: 98
Creatinine, Ser: 0.9
Sodium: 136

## 2011-09-06 LAB — CBC
Hemoglobin: 14.1
Platelets: 201
RDW: 14.9
WBC: 9.2

## 2011-09-06 LAB — POCT CARDIAC MARKERS
CKMB, poc: 1.3
Myoglobin, poc: 64.1

## 2011-09-12 LAB — DIFFERENTIAL
Basophils Absolute: 0
Basophils Relative: 1
Eosinophils Absolute: 0.3
Eosinophils Relative: 3
Lymphs Abs: 1.7

## 2011-09-12 LAB — COMPREHENSIVE METABOLIC PANEL
ALT: 39 — ABNORMAL HIGH
AST: 68 — ABNORMAL HIGH
Alkaline Phosphatase: 85
CO2: 31
Chloride: 101
GFR calc Af Amer: >60
GFR calc non Af Amer: >60
Potassium: 3.4 — ABNORMAL LOW
Sodium: 140
Total Bilirubin: 0.9

## 2011-09-12 LAB — CBC
RBC: 4.11
WBC: 10.5

## 2011-09-12 LAB — POCT CARDIAC MARKERS
Myoglobin, poc: 94.1
Operator id: 234501

## 2011-09-12 LAB — D-DIMER, QUANTITATIVE: D-Dimer, Quant: 0.34

## 2011-09-12 LAB — I-STAT 8, (EC8 V) (CONVERTED LAB)
Glucose, Bld: 94
Potassium: 3.4 — ABNORMAL LOW
TCO2: 32
pH, Ven: 7.438 — ABNORMAL HIGH

## 2012-04-04 ENCOUNTER — Other Ambulatory Visit (HOSPITAL_COMMUNITY): Payer: Self-pay | Admitting: Family Medicine

## 2012-04-04 DIAGNOSIS — Z1231 Encounter for screening mammogram for malignant neoplasm of breast: Secondary | ICD-10-CM

## 2012-04-07 ENCOUNTER — Encounter (HOSPITAL_COMMUNITY): Payer: Self-pay | Admitting: Emergency Medicine

## 2012-04-07 ENCOUNTER — Emergency Department (HOSPITAL_COMMUNITY)
Admission: EM | Admit: 2012-04-07 | Discharge: 2012-04-07 | Disposition: A | Discharge status: Home / Self Care | Payer: Medicare Other | Attending: Emergency Medicine | Admitting: Emergency Medicine

## 2012-04-07 DIAGNOSIS — M545 Low back pain, unspecified: Secondary | ICD-10-CM | POA: Insufficient documentation

## 2012-04-07 DIAGNOSIS — F17200 Nicotine dependence, unspecified, uncomplicated: Secondary | ICD-10-CM | POA: Insufficient documentation

## 2012-04-07 DIAGNOSIS — K760 Fatty (change of) liver, not elsewhere classified: Secondary | ICD-10-CM | POA: Insufficient documentation

## 2012-04-07 DIAGNOSIS — IMO0002 Reserved for concepts with insufficient information to code with codable children: Secondary | ICD-10-CM | POA: Insufficient documentation

## 2012-04-07 HISTORY — DX: Fatty (change of) liver, not elsewhere classified: K76.0

## 2012-04-07 HISTORY — DX: Reserved for concepts with insufficient information to code with codable children: IMO0002

## 2012-04-07 MED ORDER — CYCLOBENZAPRINE HCL 10 MG PO TABS: 10.0000 mg | ORAL_TABLET | Freq: Two times a day (BID) | ORAL | Status: AC | PRN

## 2012-04-07 MED ORDER — HYDROCODONE-ACETAMINOPHEN 5-325 MG PO TABS: 1.0000 | ORAL_TABLET | Freq: Four times a day (QID) | ORAL | Status: AC | PRN

## 2012-04-07 MED ORDER — CYCLOBENZAPRINE HCL 10 MG PO TABS: 10.0000 mg | ORAL_TABLET | Freq: Two times a day (BID) | ORAL | Status: DC | PRN

## 2012-04-07 MED ORDER — NAPROXEN 500 MG PO TABS: 500.0000 mg | ORAL_TABLET | Freq: Two times a day (BID) | ORAL | Status: DC

## 2012-04-07 MED ORDER — HYDROCODONE-ACETAMINOPHEN 5-325 MG PO TABS: 1.0000 | ORAL_TABLET | Freq: Four times a day (QID) | ORAL | Status: DC | PRN

## 2012-04-07 MED ORDER — HYDROCODONE-ACETAMINOPHEN 5-325 MG PO TABS
1.0000 | ORAL_TABLET | Freq: Once | ORAL | Status: AC
  Administered 2012-04-07: 1 via ORAL
  Filled 2012-04-07: qty 1

## 2012-04-07 MED ORDER — HYDROMORPHONE HCL PF 2 MG/ML IJ SOLN
2.0000 mg | Freq: Once | INTRAMUSCULAR | Status: AC
  Administered 2012-04-07: 2 mg via INTRAMUSCULAR
  Filled 2012-04-07: qty 1

## 2012-04-07 MED ORDER — NAPROXEN 500 MG PO TABS: 500.0000 mg | ORAL_TABLET | Freq: Two times a day (BID) | ORAL | Status: AC

## 2012-04-07 NOTE — ED Notes (Signed)
Pt. Given cold soda. No other needs voiced.

## 2012-04-07 NOTE — Discharge Instructions (Signed)
Take pain medicine as directed take Naprosyn as directed take Flexeril. The Naprosyn as anti-inflammatory and to help heal the back. It would be similar to the Anaprox that she taken in the past. Take hydrocodone as needed. Rest off her feet as much as possible. Followup with [facility] urgent care if not improving over the next one to 2 days. Return for new or worse symptoms.

## 2012-04-07 NOTE — ED Notes (Addendum)
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  Patient able to move all extremities without difficulty.  Denies injury.

## 2012-04-07 NOTE — ED Provider Notes (Signed)
History     CSN: [id number]  Arrival date & time 04/07/12  0227   First MD Initiated Contact with Patient 04/07/12 [phone]      Chief Complaint  Patient presents with  . Back Pain    (Consider location/radiation/quality/duration/timing/severity/associated sxs/prior treatment) Patient is a 56 y.o. female presenting with back pain. The history is provided by the patient.  Back Pain  This is a new problem. The current episode started more than 1 week ago. The problem occurs constantly. The problem has been gradually worsening. The pain is associated with no known injury. The pain is present in the lumbar spine. The quality of the pain is described as stabbing. The pain does not radiate. The pain is at a severity of 10/10. The pain is moderate. The symptoms are aggravated by bending and certain positions. Pertinent negatives include no chest pain, no fever, no numbness, no headaches, no abdominal pain, no bowel incontinence, no perianal numbness, no bladder incontinence, no dysuria, no leg pain, no paresthesias, no paresis, no tingling and no weakness. She has tried nothing for the symptoms.   patient with history of back problems in the past but none recently. Left lumbar back pain started approximately one week ago does not radiate no left lower extremity focal deficits. Pains been gradually increasing. Patient in the past is taken pain medicine Flexeril and Anaprox to help with this. Patient is followed by late she next urgent care.  Past Medical History  Diagnosis Date  . Bulging disc   . Fatty liver     Past Surgical History  Procedure Date  . Cesarean section   . Abdominal hysterectomy   . Ankle surgery   . Appendectomy     History reviewed. No pertinent family history.  History  Substance Use Topics  . Smoking status: Current Everyday Smoker -- 2.0 packs/day  . Smokeless tobacco: Not on file  . Alcohol Use: Yes    OB History    Grav Para Term Preterm Abortions TAB SAB Ect  Mult Living                  Review of Systems  Constitutional: Negative for fever.  Respiratory: Negative for shortness of breath.   Cardiovascular: Negative for chest pain.  Gastrointestinal: Negative for nausea, vomiting, abdominal pain and bowel incontinence.  Genitourinary: Negative for bladder incontinence and dysuria.  Musculoskeletal: Positive for back pain.  Skin: Negative for rash.  Neurological: Negative for tingling, weakness, numbness, headaches and paresthesias.  Hematological: Does not bruise/bleed easily.    Allergies  Aspirin  Home Medications   Current Outpatient Rx  Name Route Sig Dispense Refill  . CYCLOBENZAPRINE HCL 10 MG PO TABS Oral Take 10 mg by mouth 3 (three) times daily as needed. For pain    . HYDROCODONE-ACETAMINOPHEN 5-500 MG PO CAPS Oral Take 1 capsule by mouth every 6 (six) hours as needed. For pain    . LISINOPRIL-HYDROCHLOROTHIAZIDE 20-12.5 MG PO TABS Oral Take 1 tablet by mouth daily.    . MELOXICAM 15 MG PO TABS Oral Take 15 mg by mouth daily.    Marland Kitchen NAPROXEN SODIUM 220 MG PO TABS Oral Take 220 mg by mouth 2 (two) times daily with a meal.    . CYCLOBENZAPRINE HCL 10 MG PO TABS Oral Take 1 tablet (10 mg total) by mouth 2 (two) times daily as needed for muscle spasms. 20 tablet 0  . HYDROCODONE-ACETAMINOPHEN 5-325 MG PO TABS Oral Take 1-2 tablets by mouth every 6 (  six) hours as needed for pain. 14 tablet 0  . NAPROXEN 500 MG PO TABS Oral Take 1 tablet (500 mg total) by mouth 2 (two) times daily. 14 tablet 0    BP 112/63  Pulse 75  Temp(Src) 98.5 F (36.9 C) (Oral)  Resp 18  SpO2 91%  Physical Exam  Nursing note and vitals reviewed. Constitutional: She is oriented to person, place, and time. She appears well-developed and well-nourished.  HENT:  Head: Normocephalic and atraumatic.  Mouth/Throat: Oropharynx is clear and moist.  Eyes: Conjunctivae and EOM are normal. Pupils are equal, round, and reactive to light.  Neck: Normal range of  motion. Neck supple.  Cardiovascular: Normal rate, regular rhythm and normal heart sounds.   No murmur heard. Pulmonary/Chest: Effort normal and breath sounds normal.  Abdominal: Soft. Bowel sounds are normal. There is no tenderness.  Musculoskeletal: Normal range of motion. She exhibits no edema and no tenderness.       Lumbar back nontender to palpation.  Neurological: She is alert and oriented to person, place, and time. She has normal reflexes. No cranial nerve deficit. She exhibits normal muscle tone. Coordination normal.  Skin: Skin is warm. No rash noted.    ED Course  Procedures (including critical care time)  Labs Reviewed - No data to display No results found.   1. Acute lumbar back pain       MDM  Patient with trouble with her low back in the past no new or recent injury. No neuro deficits most of the pain is in the left lumbar area left lower extremity without any focal deficits. Patient improves some with pain medicine here. Patient has followup with [facility] urgent care.        Shelda Jakes, MD 04/07/12 [phone]

## 2012-04-07 NOTE — ED Notes (Signed)
Pt comes to the ED complaining of left lower back pain for the past week.  Pt took aleve and hydrocodone with no relief.  Pt denies any injury to area but states she has a history of chronic back pain.

## 2012-04-09 ENCOUNTER — Ambulatory Visit (HOSPITAL_COMMUNITY): Payer: Medicare Other

## 2012-05-03 ENCOUNTER — Ambulatory Visit (HOSPITAL_COMMUNITY)
Admission: RE | Admit: 2012-05-03 | Discharge: 2012-05-03 | Disposition: A | Discharge status: Home / Self Care | Payer: Medicare Other | Source: Ambulatory Visit | Attending: Family Medicine | Admitting: Family Medicine

## 2012-05-03 DIAGNOSIS — Z1231 Encounter for screening mammogram for malignant neoplasm of breast: Secondary | ICD-10-CM | POA: Insufficient documentation

## 2013-06-16 ENCOUNTER — Other Ambulatory Visit (HOSPITAL_COMMUNITY): Payer: Self-pay | Admitting: Family Medicine

## 2013-06-16 DIAGNOSIS — Z1231 Encounter for screening mammogram for malignant neoplasm of breast: Secondary | ICD-10-CM

## 2013-06-27 ENCOUNTER — Ambulatory Visit (HOSPITAL_COMMUNITY): Payer: Medicare Other

## 2013-07-25 ENCOUNTER — Ambulatory Visit (HOSPITAL_COMMUNITY)
Admission: RE | Admit: 2013-07-25 | Discharge: 2013-07-25 | Disposition: A | Discharge status: Home / Self Care | Payer: Medicare Other | Source: Ambulatory Visit | Attending: Family Medicine | Admitting: Family Medicine

## 2013-07-25 DIAGNOSIS — Z1231 Encounter for screening mammogram for malignant neoplasm of breast: Secondary | ICD-10-CM | POA: Insufficient documentation

## 2014-09-29 ENCOUNTER — Other Ambulatory Visit (HOSPITAL_COMMUNITY): Payer: Self-pay | Admitting: Physician Assistant

## 2014-09-29 DIAGNOSIS — Z1231 Encounter for screening mammogram for malignant neoplasm of breast: Secondary | ICD-10-CM

## 2014-10-07 ENCOUNTER — Ambulatory Visit (HOSPITAL_COMMUNITY)
Admission: RE | Admit: 2014-10-07 | Discharge: 2014-10-07 | Disposition: A | Discharge status: Home / Self Care | Payer: Medicare Other | Source: Ambulatory Visit | Attending: Physician Assistant | Admitting: Physician Assistant

## 2014-10-07 DIAGNOSIS — Z1231 Encounter for screening mammogram for malignant neoplasm of breast: Secondary | ICD-10-CM | POA: Diagnosis not present

## 2015-01-28 ENCOUNTER — Encounter (HOSPITAL_COMMUNITY): Payer: Self-pay

## 2015-01-28 ENCOUNTER — Emergency Department (HOSPITAL_COMMUNITY)
Admission: EM | Admit: 2015-01-28 | Discharge: 2015-01-28 | Disposition: A | Discharge status: Home / Self Care | Payer: Medicare Other | Attending: Emergency Medicine | Admitting: Emergency Medicine

## 2015-01-28 DIAGNOSIS — Z79899 Other long term (current) drug therapy: Secondary | ICD-10-CM | POA: Diagnosis not present

## 2015-01-28 DIAGNOSIS — Z9049 Acquired absence of other specified parts of digestive tract: Secondary | ICD-10-CM | POA: Diagnosis not present

## 2015-01-28 DIAGNOSIS — N390 Urinary tract infection, site not specified: Secondary | ICD-10-CM | POA: Insufficient documentation

## 2015-01-28 DIAGNOSIS — Z8739 Personal history of other diseases of the musculoskeletal system and connective tissue: Secondary | ICD-10-CM | POA: Insufficient documentation

## 2015-01-28 DIAGNOSIS — Z791 Long term (current) use of non-steroidal anti-inflammatories (NSAID): Secondary | ICD-10-CM | POA: Insufficient documentation

## 2015-01-28 DIAGNOSIS — D72829 Elevated white blood cell count, unspecified: Secondary | ICD-10-CM | POA: Insufficient documentation

## 2015-01-28 DIAGNOSIS — Z8719 Personal history of other diseases of the digestive system: Secondary | ICD-10-CM | POA: Diagnosis not present

## 2015-01-28 DIAGNOSIS — Z720 Tobacco use: Secondary | ICD-10-CM | POA: Insufficient documentation

## 2015-01-28 DIAGNOSIS — Z90710 Acquired absence of both cervix and uterus: Secondary | ICD-10-CM | POA: Diagnosis not present

## 2015-01-28 DIAGNOSIS — R1030 Lower abdominal pain, unspecified: Secondary | ICD-10-CM | POA: Diagnosis present

## 2015-01-28 LAB — URINALYSIS, ROUTINE W REFLEX MICROSCOPIC
GLUCOSE, UA: NEGATIVE mg/dL
HGB URINE DIPSTICK: NEGATIVE
Ketones, ur: NEGATIVE mg/dL
Nitrite: NEGATIVE
PH: 6 (ref 5.0–8.0)
PROTEIN: NEGATIVE mg/dL
SPECIFIC GRAVITY, URINE: 1.037 — AB (ref 1.005–1.030)
Urobilinogen, UA: 1 mg/dL (ref 0.0–1.0)

## 2015-01-28 LAB — COMPREHENSIVE METABOLIC PANEL
ALBUMIN: 4.4 g/dL (ref 3.5–5.2)
ALT: 28 U/L (ref 0–35)
AST: 44 U/L — ABNORMAL HIGH (ref 0–37)
Alkaline Phosphatase: 76 U/L (ref 39–117)
Anion gap: 10 (ref 5–15)
BILIRUBIN TOTAL: 1 mg/dL (ref 0.3–1.2)
BUN: 14 mg/dL (ref 6–23)
CALCIUM: 9.4 mg/dL (ref 8.4–10.5)
CHLORIDE: 103 mmol/L (ref 96–112)
CO2: 25 mmol/L (ref 19–32)
CREATININE: 0.88 mg/dL (ref 0.50–1.10)
GFR, EST AFRICAN AMERICAN: 82 mL/min — AB (ref >90)
GFR, EST NON AFRICAN AMERICAN: 71 mL/min — AB (ref >90)
Glucose, Bld: 99 mg/dL (ref 70–99)
Potassium: 4 mmol/L (ref 3.5–5.1)
Sodium: 138 mmol/L (ref 135–145)
Total Protein: 8.4 g/dL — ABNORMAL HIGH (ref 6.0–8.3)

## 2015-01-28 LAB — CBC WITH DIFFERENTIAL/PLATELET
BASOS ABS: 0 10*3/uL (ref 0.0–0.1)
BASOS PCT: 0 % (ref 0–1)
EOS PCT: 3 % (ref 0–5)
Eosinophils Absolute: 0.4 10*3/uL (ref 0.0–0.7)
HEMATOCRIT: 38.5 % (ref 36.0–46.0)
Hemoglobin: 12.8 g/dL (ref 12.0–15.0)
Lymphocytes Relative: 28 % (ref 12–46)
Lymphs Abs: 3.3 10*3/uL (ref 0.7–4.0)
MCH: 31.4 pg (ref 26.0–34.0)
MCHC: 33.2 g/dL (ref 30.0–36.0)
MCV: 94.4 fL (ref 78.0–100.0)
MONO ABS: 0.9 10*3/uL (ref 0.1–1.0)
Monocytes Relative: 8 % (ref 3–12)
Neutro Abs: 7.3 10*3/uL (ref 1.7–7.7)
Neutrophils Relative %: 61 % (ref 43–77)
PLATELETS: 198 10*3/uL (ref 150–400)
RBC: 4.08 MIL/uL (ref 3.87–5.11)
RDW: 15 % (ref 11.5–15.5)
WBC: 11.9 10*3/uL — AB (ref 4.0–10.5)

## 2015-01-28 LAB — URINE MICROSCOPIC-ADD ON

## 2015-01-28 LAB — LIPASE, BLOOD: Lipase: 29 U/L (ref 11–59)

## 2015-01-28 MED ORDER — MORPHINE SULFATE 4 MG/ML IJ SOLN
4.0000 mg | Freq: Once | INTRAMUSCULAR | Status: AC
  Administered 2015-01-28: 4 mg via INTRAVENOUS
  Filled 2015-01-28: qty 1

## 2015-01-28 MED ORDER — ONDANSETRON HCL 4 MG/2ML IJ SOLN
4.0000 mg | Freq: Once | INTRAMUSCULAR | Status: AC
  Administered 2015-01-28: 4 mg via INTRAVENOUS
  Filled 2015-01-28: qty 2

## 2015-01-28 MED ORDER — TRAMADOL HCL 50 MG PO TABS: 50.0000 mg | ORAL_TABLET | Freq: Four times a day (QID) | ORAL | Status: DC | PRN

## 2015-01-28 MED ORDER — CEPHALEXIN 500 MG PO CAPS
500.0000 mg | ORAL_CAPSULE | Freq: Four times a day (QID) | ORAL | Status: DC
Start: 2015-01-28 — End: 2015-09-22

## 2015-01-28 MED ORDER — DEXTROSE 5 % IV SOLN
1.0000 g | Freq: Once | INTRAVENOUS | Status: AC
  Administered 2015-01-28: 1 g via INTRAVENOUS
  Filled 2015-01-28: qty 10

## 2015-01-28 NOTE — ED Provider Notes (Signed)
CSN: [id number]     Arrival date & time 01/28/15  0053 History   First MD Initiated Contact with Patient 01/28/15 0104     Chief Complaint  Patient presents with  . Abdominal Pain     (Consider location/radiation/quality/duration/timing/severity/associated sxs/prior Treatment) HPI Comments: Patient is a 59 year old female with a past medical history of bulging disc and fatty liver who presents with abdominal pain for the past 4 days. The pain is located in the lower abdomen and radiates to her upper abdomen. The pain is described as pressure and moderate. The pain started gradually and progressively worsened since the onset. No alleviating/aggravating factors. The patient has tried epsom salt for constipation without relief. Associated symptoms include nothing. Patient denies fever, headache, NVD, chest pain, SOB, dysuria, constipation. Patient reports previous abdominal hysterectomy and appendectomy.      Past Medical History  Diagnosis Date  . Bulging disc   . Fatty liver    Past Surgical History  Procedure Laterality Date  . Cesarean section    . Abdominal hysterectomy    . Ankle surgery    . Appendectomy     History reviewed. No pertinent family history. History  Substance Use Topics  . Smoking status: Current Every Day Smoker -- 2.00 packs/day  . Smokeless tobacco: Not on file  . Alcohol Use: Not on file     Comment: daily   OB History    No data available     Review of Systems  Constitutional: Negative for fever, chills and fatigue.  HENT: Negative for trouble swallowing.   Eyes: Negative for visual disturbance.  Respiratory: Negative for shortness of breath.   Cardiovascular: Negative for chest pain and palpitations.  Gastrointestinal: Positive for abdominal pain. Negative for nausea, vomiting and diarrhea.  Genitourinary: Negative for dysuria and difficulty urinating.  Musculoskeletal: Negative for arthralgias and neck pain.  Skin: Negative for color change.   Neurological: Negative for dizziness and weakness.  Psychiatric/Behavioral: Negative for dysphoric mood.      Allergies  Aspirin  Home Medications   Prior to Admission medications   Medication Sig Start Date End Date Taking? Authorizing Provider  cyclobenzaprine (FLEXERIL) 10 MG tablet Take 10 mg by mouth 3 (three) times daily as needed. For pain    Historical Provider, MD  hydrocodone-acetaminophen (LORCET-HD) 5-500 MG per capsule Take 1 capsule by mouth every 6 (six) hours as needed. For pain    Historical Provider, MD  lisinopril-hydrochlorothiazide (PRINZIDE,ZESTORETIC) 20-12.5 MG per tablet Take 1 tablet by mouth daily.    Historical Provider, MD  meloxicam (MOBIC) 15 MG tablet Take 15 mg by mouth daily.    Historical Provider, MD  naproxen sodium (ANAPROX) 220 MG tablet Take 220 mg by mouth 2 (two) times daily with a meal.    Historical Provider, MD   BP 140/82 mmHg  Pulse 72  Temp(Src) 97.7 F (36.5 C) (Oral)  Resp 24  Ht 5' 6.5" (1.689 m)  Wt 193 lb (87.544 kg)  BMI 30.69 kg/m2  SpO2 97% Physical Exam  Constitutional: She is oriented to person, place, and time. She appears well-developed and well-nourished. No distress.  HENT:  Head: Normocephalic and atraumatic.  Eyes: Conjunctivae and EOM are normal.  Neck: Normal range of motion.  Cardiovascular: Normal rate and regular rhythm.  Exam reveals no gallop and no friction rub.   No murmur heard. Pulmonary/Chest: Effort normal and breath sounds normal. She has no wheezes. She has no rales. She exhibits no tenderness.  Abdominal:  Soft. She exhibits no distension. There is tenderness. There is no rebound and no guarding.  Suprapubic tenderness to palpation. No focal tenderness to palpation. No peritoneal.   Genitourinary:  No CVA tenderness.   Musculoskeletal: Normal range of motion.  Lymphadenopathy:    She has no cervical adenopathy.  Neurological: She is alert and oriented to person, place, and time. Coordination  normal.  Speech is goal-oriented. Moves limbs without ataxia.   Skin: Skin is warm and dry.  Psychiatric: She has a normal mood and affect. Her behavior is normal.  Nursing note and vitals reviewed.   ED Course  Procedures (including critical care time) Labs Review Labs Reviewed  CBC WITH DIFFERENTIAL/PLATELET - Abnormal; Notable for the following:    WBC 11.9 (*)    All other components within normal limits  COMPREHENSIVE METABOLIC PANEL - Abnormal; Notable for the following:    Total Protein 8.4 (*)    AST 44 (*)    GFR calc non Af Amer 71 (*)    GFR calc Af Amer 82 (*)    All other components within normal limits  URINALYSIS, ROUTINE W REFLEX MICROSCOPIC - Abnormal; Notable for the following:    Color, Urine AMBER (*)    APPearance CLOUDY (*)    Specific Gravity, Urine 1.037 (*)    Bilirubin Urine SMALL (*)    Leukocytes, UA MODERATE (*)    All other components within normal limits  URINE MICROSCOPIC-ADD ON - Abnormal; Notable for the following:    Bacteria, UA MANY (*)    All other components within normal limits  LIPASE, BLOOD    Imaging Review No results found.   EKG Interpretation None      MDM   Final diagnoses:  UTI (lower urinary tract infection)    4:09 AM Patient has elevated WBC at 11.9 and urinalysis shows UTI. Patient will be treated with IV rocephin. Vitals stable and patient afebrile. Patient given morphine for pain. Patient reports improvement of symptoms. Patient instructed to return with worsening or concerning symptoms.   Emilia BeckKaitlyn Nathaly Dawkins, PA-C 01/28/15 0422  Derwood KaplanAnkit Nanavati, MD 01/29/15 [phone]

## 2015-01-28 NOTE — Discharge Instructions (Signed)
Take Keflex as directed until gone. Take Tramadol as needed for pain. Refer to attached documents for more information. Return to the ED with worsening or concerning symptoms.  °

## 2015-01-28 NOTE — ED Notes (Signed)
PA at bedside.

## 2015-01-28 NOTE — ED Notes (Signed)
Patient reports lower abdominal pain that began 4 days ago.  She states it radiates up to her chest, then back down to her abdomen again.  Denies N/V/D.  Reports she took Epsom salt to relieve constipation.  She said her stool was very black.

## 2015-09-03 ENCOUNTER — Other Ambulatory Visit: Payer: Self-pay

## 2015-09-03 DIAGNOSIS — Z1231 Encounter for screening mammogram for malignant neoplasm of breast: Secondary | ICD-10-CM

## 2015-09-22 ENCOUNTER — Encounter (HOSPITAL_COMMUNITY): Payer: Self-pay | Admitting: Emergency Medicine

## 2015-09-22 ENCOUNTER — Emergency Department (HOSPITAL_COMMUNITY)
Admission: EM | Admit: 2015-09-22 | Discharge: 2015-09-22 | Disposition: A | Discharge status: Home / Self Care | Payer: Medicare Other | Attending: Emergency Medicine | Admitting: Emergency Medicine

## 2015-09-22 DIAGNOSIS — Z792 Long term (current) use of antibiotics: Secondary | ICD-10-CM | POA: Diagnosis not present

## 2015-09-22 DIAGNOSIS — L539 Erythematous condition, unspecified: Secondary | ICD-10-CM | POA: Diagnosis present

## 2015-09-22 DIAGNOSIS — L03113 Cellulitis of right upper limb: Secondary | ICD-10-CM | POA: Diagnosis not present

## 2015-09-22 DIAGNOSIS — Z720 Tobacco use: Secondary | ICD-10-CM | POA: Diagnosis not present

## 2015-09-22 DIAGNOSIS — Z7982 Long term (current) use of aspirin: Secondary | ICD-10-CM | POA: Insufficient documentation

## 2015-09-22 DIAGNOSIS — Z79899 Other long term (current) drug therapy: Secondary | ICD-10-CM | POA: Diagnosis not present

## 2015-09-22 DIAGNOSIS — I10 Essential (primary) hypertension: Secondary | ICD-10-CM | POA: Insufficient documentation

## 2015-09-22 HISTORY — DX: Essential (primary) hypertension: I10

## 2015-09-22 MED ORDER — CEPHALEXIN 500 MG PO CAPS: 500.0000 mg | ORAL_CAPSULE | Freq: Four times a day (QID) | ORAL | Status: DC

## 2015-09-22 MED ORDER — CEPHALEXIN 500 MG PO CAPS
500.0000 mg | ORAL_CAPSULE | Freq: Once | ORAL | Status: AC
  Administered 2015-09-22: 500 mg via ORAL
  Filled 2015-09-22: qty 1

## 2015-09-22 NOTE — ED Provider Notes (Signed)
CSN: [id number]     Arrival date & time 09/22/15  0334 History   First MD Initiated Contact with Patient 09/22/15 0341     Chief Complaint  Patient presents with  . Insect Bite     (Consider location/radiation/quality/duration/timing/severity/associated sxs/prior Treatment) HPI Comments: Patient presents with concern for increasing pain and redness at the site of an insect bite to right arm. No fever, ulceration or drainage. She did not see an insect or spider but reports being out in the yard a lot recently.   The history is provided by the patient. No language interpreter was used.    Past Medical History  Diagnosis Date  . Bulging disc   . Fatty liver   . Hypertension    Past Surgical History  Procedure Laterality Date  . Cesarean section    . Abdominal hysterectomy    . Ankle surgery    . Appendectomy     Family History  Problem Relation Age of Onset  . Deep vein thrombosis Mother    Social History  Substance Use Topics  . Smoking status: Current Every Day Smoker -- 2.00 packs/day  . Smokeless tobacco: None  . Alcohol Use: Yes     Comment: daily   OB History    No data available     Review of Systems  Constitutional: Negative for fever.  Gastrointestinal: Negative for nausea and vomiting.  Musculoskeletal: Negative for myalgias.  Skin: Positive for color change and wound.      Allergies  Aspirin  Home Medications   Prior to Admission medications   Medication Sig Start Date End Date Taking? Authorizing Provider  albuterol (PROVENTIL HFA;VENTOLIN HFA) 108 (90 BASE) MCG/ACT inhaler Inhale 1-2 puffs into the lungs every 6 (six) hours as needed for wheezing or shortness of breath.    Historical Provider, MD  cephALEXin (KEFLEX) 500 MG capsule Take 1 capsule (500 mg total) by mouth 4 (four) times daily. 01/28/15   Emilia Beck, PA-C  cyclobenzaprine (FLEXERIL) 10 MG tablet Take 10 mg by mouth 2 (two) times daily as needed for muscle spasms.     Historical Provider, MD  lisinopril-hydrochlorothiazide (PRINZIDE,ZESTORETIC) 20-12.5 MG per tablet Take 1 tablet by mouth daily.    Historical Provider, MD  naproxen sodium (ANAPROX) 220 MG tablet Take 220 mg by mouth 2 (two) times daily with a meal.    Historical Provider, MD  traMADol (ULTRAM) 50 MG tablet Take 1 tablet (50 mg total) by mouth every 6 (six) hours as needed. 01/28/15   Kaitlyn Szekalski, PA-C   BP 141/83 mmHg  Pulse 73  Temp(Src) 97.7 F (36.5 C) (Oral)  Resp 18  SpO2 100% Physical Exam  Constitutional: She is oriented to person, place, and time. She appears well-developed and well-nourished.  Neck: Normal range of motion.  Pulmonary/Chest: Effort normal.  Neurological: She is alert and oriented to person, place, and time.  Skin: Skin is warm and dry.  Raised area of induration right volar forearm without blistering, ulceration or drainage. There is a srrounding area of erythema to approximately 5 cm. No streaking. Mildly tender.     ED Course  Procedures (including critical care time) Labs Review Labs Reviewed - No data to display  Imaging Review No results found. I have personally reviewed and evaluated these images and lab results as part of my medical decision-making.   EKG Interpretation None      MDM   Final diagnoses:  None    1. Cellulitis   Area  of redness concerning for infection appropriately treated with oral antibiotics in outpatient setting. Return precautions discussed.     Elpidio AnisShari Shahab Polhamus, PA-C 09/22/15 0408  Cy BlamerApril Palumbo, MD 09/22/15 [phone]

## 2015-09-22 NOTE — ED Notes (Signed)
Pt states something bit her on her right arm on Sunday  Pt states she does not know what it was  Pt has red raised area noted to her right lower arm  Area is hot to the touch

## 2015-09-22 NOTE — Discharge Instructions (Signed)

## 2015-10-11 ENCOUNTER — Ambulatory Visit
Admission: RE | Admit: 2015-10-11 | Discharge: 2015-10-11 | Disposition: A | Discharge status: Home / Self Care | Payer: Medicare Other | Source: Ambulatory Visit

## 2015-10-11 DIAGNOSIS — Z1231 Encounter for screening mammogram for malignant neoplasm of breast: Secondary | ICD-10-CM

## 2016-04-19 ENCOUNTER — Emergency Department (HOSPITAL_COMMUNITY): Payer: Medicare Other

## 2016-04-19 ENCOUNTER — Encounter (HOSPITAL_COMMUNITY): Payer: Self-pay

## 2016-04-19 ENCOUNTER — Inpatient Hospital Stay (HOSPITAL_COMMUNITY)
Admission: EM | Admit: 2016-04-19 | Discharge: 2016-04-22 | DRG: 392 | Disposition: A | Discharge status: Home / Self Care | Payer: Medicare Other | Attending: Internal Medicine | Admitting: Internal Medicine

## 2016-04-19 DIAGNOSIS — H578 Other specified disorders of eye and adnexa: Secondary | ICD-10-CM | POA: Diagnosis present

## 2016-04-19 DIAGNOSIS — E871 Hypo-osmolality and hyponatremia: Secondary | ICD-10-CM | POA: Diagnosis present

## 2016-04-19 DIAGNOSIS — N390 Urinary tract infection, site not specified: Secondary | ICD-10-CM | POA: Diagnosis present

## 2016-04-19 DIAGNOSIS — E860 Dehydration: Secondary | ICD-10-CM | POA: Diagnosis present

## 2016-04-19 DIAGNOSIS — D649 Anemia, unspecified: Secondary | ICD-10-CM | POA: Diagnosis present

## 2016-04-19 DIAGNOSIS — I10 Essential (primary) hypertension: Secondary | ICD-10-CM | POA: Diagnosis present

## 2016-04-19 DIAGNOSIS — D696 Thrombocytopenia, unspecified: Secondary | ICD-10-CM | POA: Diagnosis present

## 2016-04-19 DIAGNOSIS — K7581 Nonalcoholic steatohepatitis (NASH): Secondary | ICD-10-CM | POA: Diagnosis present

## 2016-04-19 DIAGNOSIS — I959 Hypotension, unspecified: Secondary | ICD-10-CM | POA: Diagnosis present

## 2016-04-19 DIAGNOSIS — Z79899 Other long term (current) drug therapy: Secondary | ICD-10-CM | POA: Diagnosis not present

## 2016-04-19 DIAGNOSIS — K5792 Diverticulitis of intestine, part unspecified, without perforation or abscess without bleeding: Secondary | ICD-10-CM | POA: Diagnosis not present

## 2016-04-19 DIAGNOSIS — E876 Hypokalemia: Secondary | ICD-10-CM | POA: Diagnosis present

## 2016-04-19 DIAGNOSIS — Z886 Allergy status to analgesic agent status: Secondary | ICD-10-CM | POA: Diagnosis not present

## 2016-04-19 DIAGNOSIS — K5732 Diverticulitis of large intestine without perforation or abscess without bleeding: Principal | ICD-10-CM | POA: Diagnosis present

## 2016-04-19 DIAGNOSIS — F17210 Nicotine dependence, cigarettes, uncomplicated: Secondary | ICD-10-CM | POA: Diagnosis present

## 2016-04-19 DIAGNOSIS — R600 Localized edema: Secondary | ICD-10-CM | POA: Diagnosis present

## 2016-04-19 DIAGNOSIS — Z720 Tobacco use: Secondary | ICD-10-CM | POA: Diagnosis present

## 2016-04-19 LAB — COMPREHENSIVE METABOLIC PANEL
ALK PHOS: 57 U/L (ref 38–126)
ALT: 54 U/L (ref 14–54)
ANION GAP: 13 (ref 5–15)
AST: 72 U/L — ABNORMAL HIGH (ref 15–41)
Albumin: 4.6 g/dL (ref 3.5–5.0)
BILIRUBIN TOTAL: 2.7 mg/dL — AB (ref 0.3–1.2)
BUN: 12 mg/dL (ref 6–20)
CALCIUM: 9.4 mg/dL (ref 8.9–10.3)
CO2: 24 mmol/L (ref 22–32)
Chloride: 99 mmol/L — ABNORMAL LOW (ref 101–111)
Creatinine, Ser: 0.82 mg/dL (ref 0.44–1.00)
GFR calc Af Amer: >60 mL/min (ref >60)
Glucose, Bld: 135 mg/dL — ABNORMAL HIGH (ref 65–99)
POTASSIUM: 3.2 mmol/L — AB (ref 3.5–5.1)
Sodium: 136 mmol/L (ref 135–145)
TOTAL PROTEIN: 8.5 g/dL — AB (ref 6.5–8.1)

## 2016-04-19 LAB — CBC
HEMATOCRIT: 40.2 % (ref 36.0–46.0)
Hemoglobin: 14.1 g/dL (ref 12.0–15.0)
MCH: 31.4 pg (ref 26.0–34.0)
MCHC: 35.1 g/dL (ref 30.0–36.0)
MCV: 89.5 fL (ref 78.0–100.0)
Platelets: 145 10*3/uL — ABNORMAL LOW (ref 150–400)
RBC: 4.49 MIL/uL (ref 3.87–5.11)
RDW: 17.5 % — AB (ref 11.5–15.5)
WBC: 12.6 10*3/uL — AB (ref 4.0–10.5)

## 2016-04-19 LAB — URINE MICROSCOPIC-ADD ON

## 2016-04-19 LAB — URINALYSIS, ROUTINE W REFLEX MICROSCOPIC
Glucose, UA: NEGATIVE mg/dL
Hgb urine dipstick: NEGATIVE
KETONES UR: 15 mg/dL — AB
NITRITE: POSITIVE — AB
PH: 6 (ref 5.0–8.0)
PROTEIN: NEGATIVE mg/dL
Specific Gravity, Urine: 1.026 (ref 1.005–1.030)

## 2016-04-19 LAB — LIPASE, BLOOD: Lipase: 17 U/L (ref 11–51)

## 2016-04-19 LAB — PHOSPHORUS: Phosphorus: 2.3 mg/dL — ABNORMAL LOW (ref 2.5–4.6)

## 2016-04-19 LAB — MAGNESIUM: MAGNESIUM: 1.2 mg/dL — AB (ref 1.7–2.4)

## 2016-04-19 MED ORDER — POTASSIUM CHLORIDE IN NACL 20-0.9 MEQ/L-% IV SOLN
INTRAVENOUS | Status: DC
  Administered 2016-04-19 – 2016-04-22 (×5): via INTRAVENOUS
  Filled 2016-04-19 (×8): qty 1000

## 2016-04-19 MED ORDER — ONDANSETRON HCL 4 MG/2ML IJ SOLN: 4.0000 mg | Freq: Once | INTRAMUSCULAR | Status: DC

## 2016-04-19 MED ORDER — PANTOPRAZOLE SODIUM 40 MG IV SOLR
40.0000 mg | INTRAVENOUS | Status: DC
  Administered 2016-04-19 – 2016-04-21 (×3): 40 mg via INTRAVENOUS
  Filled 2016-04-19 (×4): qty 40

## 2016-04-19 MED ORDER — ONDANSETRON HCL 4 MG/2ML IJ SOLN
4.0000 mg | Freq: Four times a day (QID) | INTRAMUSCULAR | Status: DC | PRN
Start: 2016-04-19 — End: 2016-04-22

## 2016-04-19 MED ORDER — POTASSIUM PHOSPHATES 15 MMOLE/5ML IV SOLN
10.0000 mmol | Freq: Once | INTRAVENOUS | Status: AC
  Administered 2016-04-19: 10 mmol via INTRAVENOUS
  Filled 2016-04-19: qty 3.33

## 2016-04-19 MED ORDER — METRONIDAZOLE IN NACL 5-0.79 MG/ML-% IV SOLN
500.0000 mg | Freq: Once | INTRAVENOUS | Status: DC
  Administered 2016-04-19: 500 mg via INTRAVENOUS
  Filled 2016-04-19: qty 100

## 2016-04-19 MED ORDER — ERYTHROMYCIN 5 MG/GM OP OINT
TOPICAL_OINTMENT | Freq: Three times a day (TID) | OPHTHALMIC | Status: DC
  Administered 2016-04-19 – 2016-04-20 (×2): via OPHTHALMIC
  Filled 2016-04-19: qty 3.5

## 2016-04-19 MED ORDER — HYDROMORPHONE HCL 1 MG/ML IJ SOLN
1.0000 mg | INTRAMUSCULAR | Status: DC | PRN
  Administered 2016-04-19 – 2016-04-21 (×10): 1 mg via INTRAVENOUS
  Filled 2016-04-19 (×10): qty 1

## 2016-04-19 MED ORDER — ONDANSETRON HCL 4 MG/2ML IJ SOLN
4.0000 mg | Freq: Once | INTRAMUSCULAR | Status: AC
  Administered 2016-04-19: 4 mg via INTRAVENOUS
  Filled 2016-04-19: qty 2

## 2016-04-19 MED ORDER — FENTANYL CITRATE (PF) 100 MCG/2ML IJ SOLN
50.0000 ug | Freq: Once | INTRAMUSCULAR | Status: AC
  Administered 2016-04-19: 50 ug via INTRAVENOUS
  Filled 2016-04-19: qty 2

## 2016-04-19 MED ORDER — METOPROLOL TARTRATE 5 MG/5ML IV SOLN
5.0000 mg | Freq: Four times a day (QID) | INTRAVENOUS | Status: DC
  Administered 2016-04-19 – 2016-04-20 (×3): 5 mg via INTRAVENOUS
  Filled 2016-04-19 (×7): qty 5

## 2016-04-19 MED ORDER — CIPROFLOXACIN IN D5W 400 MG/200ML IV SOLN
400.0000 mg | Freq: Once | INTRAVENOUS | Status: AC
  Administered 2016-04-19: 400 mg via INTRAVENOUS
  Filled 2016-04-19: qty 200

## 2016-04-19 MED ORDER — ONDANSETRON HCL 4 MG PO TABS: 4.0000 mg | ORAL_TABLET | Freq: Four times a day (QID) | ORAL | Status: DC | PRN

## 2016-04-19 MED ORDER — IOPAMIDOL (ISOVUE-300) INJECTION 61%
100.0000 mL | Freq: Once | INTRAVENOUS | Status: AC | PRN
  Administered 2016-04-19: 100 mL via INTRAVENOUS

## 2016-04-19 MED ORDER — MAGNESIUM SULFATE 50 % IJ SOLN
6.0000 g | Freq: Once | INTRAMUSCULAR | Status: AC
  Administered 2016-04-19: 6 g via INTRAVENOUS
  Filled 2016-04-19: qty 12

## 2016-04-19 MED ORDER — HYDROMORPHONE HCL 1 MG/ML IJ SOLN: 1.0000 mg | Freq: Once | INTRAMUSCULAR | Status: DC

## 2016-04-19 MED ORDER — METRONIDAZOLE IN NACL 5-0.79 MG/ML-% IV SOLN
500.0000 mg | Freq: Three times a day (TID) | INTRAVENOUS | Status: DC
  Administered 2016-04-19 – 2016-04-22 (×8): 500 mg via INTRAVENOUS
  Filled 2016-04-19 (×9): qty 100

## 2016-04-19 MED ORDER — CIPROFLOXACIN IN D5W 400 MG/200ML IV SOLN
400.0000 mg | Freq: Two times a day (BID) | INTRAVENOUS | Status: DC
  Administered 2016-04-20 – 2016-04-22 (×5): 400 mg via INTRAVENOUS
  Filled 2016-04-19 (×5): qty 200

## 2016-04-19 NOTE — ED Provider Notes (Signed)
CSN: [id number]     Arrival date & time 04/19/16  1417 History   First MD Initiated Contact with Patient 04/19/16 1619     Chief Complaint  Patient presents with  . Abdominal Pain     Patient is a 60 y.o. female presenting with abdominal pain. The history is provided by the patient.  Abdominal Pain Associated symptoms: nausea and vomiting   Associated symptoms: no chest pain, no diarrhea and no shortness of breath   Patient with abdominal pain. Has had over the last 3 days. It is dull in her right upper quadrant. Been severe today. Has had nausea with some vomiting. No diarrhea. Comes patient. No fevers. She said decreased appetite. No chills. She has not had pains like this before. Previous he told she had a fatty liver. States it felt like her previous appendicitis. This is however more in the upper abdomen on the right side. No dysuria. Patient also woke up with some swelling around her left eye. States she will swell up a lot if she gets bit by a mosquito it did not seem ischial. No fevers. The eye swollen states she's having a little trouble seeing with it. Past Medical History  Diagnosis Date  . Bulging disc   . Fatty liver   . Hypertension    Past Surgical History  Procedure Laterality Date  . Cesarean section    . Abdominal hysterectomy    . Ankle surgery    . Appendectomy     Family History  Problem Relation Age of Onset  . Deep vein thrombosis Mother    Social History  Substance Use Topics  . Smoking status: Current Every Day Smoker -- 0.50 packs/day for 20 years  . Smokeless tobacco: Never Used  . Alcohol Use: Yes     Comment: 4 or 5 drinks per day with soda and ice -04/19/16   OB History    No data available     Review of Systems  Constitutional: Positive for appetite change. Negative for activity change.  Eyes: Negative for pain.  Respiratory: Negative for chest tightness and shortness of breath.   Cardiovascular: Negative for chest pain and leg swelling.   Gastrointestinal: Positive for nausea, vomiting and abdominal pain. Negative for diarrhea.  Genitourinary: Negative for flank pain.  Musculoskeletal: Negative for back pain and neck stiffness.  Skin: Negative for rash.  Neurological: Negative for weakness, numbness and headaches.  Psychiatric/Behavioral: Negative for behavioral problems.      Allergies  Aspirin  Home Medications   Prior to Admission medications   Medication Sig Start Date End Date Taking? Authorizing Provider  albuterol (PROVENTIL HFA;VENTOLIN HFA) 108 (90 BASE) MCG/ACT inhaler Inhale 1-2 puffs into the lungs every 6 (six) hours as needed for wheezing or shortness of breath.   Yes Historical Provider, MD  atenolol-chlorthalidone (TENORETIC) 50-25 MG tablet Take 1 tablet by mouth daily. 03/10/16  Yes Historical Provider, MD  GARLIC PO Take 1 tablet by mouth daily.   Yes Historical Provider, MD  naproxen sodium (ANAPROX) 220 MG tablet Take 440 mg by mouth 2 (two) times daily as needed (for pain.).    Yes Historical Provider, MD   BP 128/81 mmHg  Pulse 84  Temp(Src) 99.3 F (37.4 C) (Oral)  Resp 16  SpO2 94% Physical Exam  Constitutional: She is oriented to person, place, and time. She appears well-developed and well-nourished.  HENT:  Head: Normocephalic and atraumatic.  Eyes: Pupils are equal, round, and reactive to light.  Neck:  Normal range of motion. Neck supple.  Cardiovascular: Normal rate and regular rhythm.   Pulmonary/Chest: Effort normal and breath sounds normal. No respiratory distress.  Abdominal: Soft. She exhibits no distension. There is tenderness.  Moderate tenderness right upper quadrant. No rebound or guarding. No hernias palpated.  Genitourinary:  No CVA tenderness.  Musculoskeletal: Normal range of motion.  Neurological: She is alert and oriented to person, place, and time.  Skin: Skin is warm.  Psychiatric: She has a normal mood and affect. Her speech is normal.  Nursing note and vitals  reviewed.   ED Course  Procedures (including critical care time) Labs Review Labs Reviewed  COMPREHENSIVE METABOLIC PANEL - Abnormal; Notable for the following:    Potassium 3.2 (*)    Chloride 99 (*)    Glucose, Bld 135 (*)    Total Protein 8.5 (*)    AST 72 (*)    Total Bilirubin 2.7 (*)    All other components within normal limits  CBC - Abnormal; Notable for the following:    WBC 12.6 (*)    RDW 17.5 (*)    Platelets 145 (*)    All other components within normal limits  URINALYSIS, ROUTINE W REFLEX MICROSCOPIC (NOT AT [facility]) - Abnormal; Notable for the following:    Color, Urine ORANGE (*)    APPearance CLOUDY (*)    Bilirubin Urine MODERATE (*)    Ketones, ur 15 (*)    Nitrite POSITIVE (*)    Leukocytes, UA MODERATE (*)    All other components within normal limits  URINE MICROSCOPIC-ADD ON - Abnormal; Notable for the following:    Squamous Epithelial / LPF 6-30 (*)    Bacteria, UA MANY (*)    All other components within normal limits  LIPASE, BLOOD  MAGNESIUM  PHOSPHORUS    Imaging Review Ct Abdomen Pelvis W Contrast  04/19/2016  CLINICAL DATA:  Lower abdominal pain 1 week. Previous appendectomy and hysterectomy. EXAM: CT ABDOMEN AND PELVIS WITH CONTRAST TECHNIQUE: Multidetector CT imaging of the abdomen and pelvis was performed using the standard protocol following bolus administration of intravenous contrast. CONTRAST:  ISOVUE-300 IOPAMIDOL (ISOVUE-300) INJECTION 61% COMPARISON:  Right upper quadrant ultrasound today. FINDINGS: Lung bases demonstrate mild dependent atelectasis in the right base. Abdominal images demonstrate moderate cholelithiasis. The liver, spleen, pancreas and adrenal glands are within normal. The kidneys normal in size without hydronephrosis or nephrolithiasis. Ureters are within normal. There is mild calcified plaque over the abdominal aorta and iliac arteries. There is moderate diverticulosis of the colon. There is an inflamed diverticula with  moderate adjacent inflammation and free fluid in the pericolonic fat involving the hepatic flexure in the right mid abdomen just below the liver. Possible subtle focus of free air adjacent the inflamed diverticula as cannot exclude a subtle contained local perforation. No evidence abscess. There is a second less severe site of acute diverticulitis involving the distal descending colon without perforation or abscess. There is a small umbilical hernia containing only peritoneal fat. Remaining pelvic images demonstrate surgical absence of the uterus. There is a small amount of free fluid. The bladder and rectum are normal. There is minimal degenerate change of the spine. IMPRESSION: Colonic diverticulosis with moderate acute diverticulitis involving the hepatic flexure. Second site of less severe acute diverticulitis involving the distal descending colon. Cannot completely exclude subtle focal contained perforation adjacent the inflamed diverticula over the hepatic flexure. No evidence abscess. Moderate cholelithiasis. Umbilical hernia containing only peritoneal fat. Electronically Signed  By: Elberta Fortis M.D.   On: 04/19/2016 19:10   US Abdomen Limited  04/19/2016  CLINICAL DATA:  Right upper quadrant pain for the past 2 days; history of fatty liver disease EXAM: US ABDOMEN LIMITED - RIGHT UPPER QUADRANT COMPARISON:  Abdominal ultrasound dated December 02, 2010 FINDINGS: Gallbladder: The gallbladder is adequately distended. There are echogenic mobile shadowing stones. There is no gallbladder wall thickening, pericholecystic fluid, or positive sonographic Murphy's sign. Common bile duct: Diameter: 1 mm Liver: The hepatic echotexture is increased diffusely. There is no focal mass nor ductal dilation. The surface contour of the liver is normal. IMPRESSION: 1. Gallstones and sludge without sonographic evidence of acute cholecystitis. 2. Fatty infiltrative change of the liver. Electronically Signed   By: David   [place] M.D.   On: 04/19/2016 17:11   I have personally reviewed and evaluated these images and lab results as part of my medical decision-making.   EKG Interpretation None      MDM   Final diagnoses:  Acute diverticulitis    Patient with abdominal pain. Right upper quadrant so initially ultrasound was done and showed gallstones but no cholecystitis. CT scan done and showed diverticulitis. Continued pain. Admit to internal medicine.    Benjiman Core, MD 04/19/16 2005

## 2016-04-19 NOTE — H&P (Signed)
History and Physical    Candice Roy JXB:[id number] DOB: 1956-03-23 DOA: 04/19/2016  [facility]   Chief Complaint: Abdominal pain.  HPI: Candice EdwardsJackie O Osmer is a 60 y.o. female with medical history significant of hypertension, fatty liver disease, LS spine bulging disc comes to the emergency department complaints of RUQ abdominal pain, nausea and decreased appetite. She also complains of left periorbital area non-tender/nonpruritic edema and erythema associated with whitish discharge.    Per patient, she was in her usual state of health 2 nights ago, but started feeling abdominal pain with nausea, decreased appetite and 4 episodes of diarrhea since yesterday. She denies vomiting, fever, but states she has had chills, night sweats and fatigue.   She states that she woke up this morning with left periorbital area edema and erythema, but denies pain or pruritus of the area. No sick contacts. No symptoms on the right eye.  ED Course: The patient received IV fluids, analgesics and antiemetics which provided some relief. Workup shows urinalysis with bacteriuria and leukocyturia, mild leukocytosis and imaging consistent with acute diverticulitis at the hepatic flexure and less severe of the distal descending colon.  Review of Systems: As per HPI otherwise 10 point review of systems negative.  Past Medical History  Diagnosis Date  . Bulging disc   . Fatty liver   . Hypertension     Past Surgical History  Procedure Laterality Date  . Cesarean section    . Abdominal hysterectomy    . Ankle surgery    . Appendectomy       reports that she has been smoking.  She has never used smokeless tobacco. She reports that she drinks alcohol. She reports that she does not use illicit drugs.  Allergies  Allergen Reactions  . Aspirin Other (See Comments)    Childhood allergy    Family History  Problem Relation Age of Onset  . Deep vein thrombosis Mother     Prior to  Admission medications   Medication Sig Start Date End Date Taking? Authorizing Provider  albuterol (PROVENTIL HFA;VENTOLIN HFA) 108 (90 BASE) MCG/ACT inhaler Inhale 1-2 puffs into the lungs every 6 (six) hours as needed for wheezing or shortness of breath.   Yes Historical Provider, MD  atenolol-chlorthalidone (TENORETIC) 50-25 MG tablet Take 1 tablet by mouth daily. 03/10/16  Yes Historical Provider, MD  GARLIC PO Take 1 tablet by mouth daily.   Yes Historical Provider, MD  naproxen sodium (ANAPROX) 220 MG tablet Take 440 mg by mouth 2 (two) times daily as needed (for pain.).    Yes Historical Provider, MD    Physical Exam: Filed Vitals:   04/19/16 1423 04/19/16 1828  BP: 117/76 128/81  Pulse: 86 84  Temp: 99.3 F (37.4 C)   TempSrc: Oral   Resp: 20 16  SpO2: 93% 94%      Constitutional: NAD, calm, comfortable Filed Vitals:   04/19/16 1423 04/19/16 1828  BP: 117/76 128/81  Pulse: 86 84  Temp: 99.3 F (37.4 C)   TempSrc: Oral   Resp: 20 16  SpO2: 93% 94%   Eyes: PERRL, Positive nontender edema and erythema in left periorbital area. ENMT: Mucous membranes are moist. Posterior pharynx clear of any exudate or lesions. Normal dentition.  Neck: normal, supple, no masses, no thyromegaly Respiratory: clear to auscultation bilaterally, no wheezing, no crackles. Normal respiratory effort. No accessory muscle use.  Cardiovascular: Regular rate and rhythm, no murmurs / rubs / gallops. No extremity edema.  2+ pedal pulses. No carotid bruits.  Abdomen: No distention, positive surgical scar. Bowel sounds positive. Positive RUQ tenderness, no guarding/rebound, no masses palpated. No hepatosplenomegaly.   Musculoskeletal: no clubbing / cyanosis. No joint deformity upper and lower extremities. Good ROM, no contractures. Normal muscle tone.  Skin: no rashes, lesions, ulcers. No induration Neurologic: CN 2-12 grossly intact. Sensation intact, DTR normal. Strength 5/5 in all 4.  Psychiatric:  Normal judgment and insight. Alert and oriented x 4. Normal mood.    Labs on Admission: I have personally reviewed following labs and imaging studies  CBC:  Recent Labs Lab 04/19/16 1432  WBC 12.6*  HGB 14.1  HCT 40.2  MCV 89.5  PLT 145*   Basic Metabolic Panel:  Recent Labs Lab 04/19/16 1432  NA 136  K 3.2*  CL 99*  CO2 24  GLUCOSE 135*  BUN 12  CREATININE 0.82  CALCIUM 9.4   GFR: CrCl cannot be calculated (Unknown ideal weight.). Liver Function Tests:  Recent Labs Lab 04/19/16 1432  AST 72*  ALT 54  ALKPHOS 57  BILITOT 2.7*  PROT 8.5*  ALBUMIN 4.6    Recent Labs Lab 04/19/16 1432  LIPASE 17   Urine analysis:    Component Value Date/Time   COLORURINE ORANGE* 04/19/2016 1711   APPEARANCEUR CLOUDY* 04/19/2016 1711   LABSPEC 1.026 04/19/2016 1711   PHURINE 6.0 04/19/2016 1711   GLUCOSEU NEGATIVE 04/19/2016 1711   HGBUR NEGATIVE 04/19/2016 1711   BILIRUBINUR MODERATE* 04/19/2016 1711   KETONESUR 15* 04/19/2016 1711   PROTEINUR NEGATIVE 04/19/2016 1711   UROBILINOGEN 1.0 01/28/2015 0120   NITRITE POSITIVE* 04/19/2016 1711   LEUKOCYTESUR MODERATE* 04/19/2016 1711    Radiological Exams on Admission: Ct Abdomen Pelvis W Contrast  04/19/2016  CLINICAL DATA:  Lower abdominal pain 1 week. Previous appendectomy and hysterectomy. EXAM: CT ABDOMEN AND PELVIS WITH CONTRAST TECHNIQUE: Multidetector CT imaging of the abdomen and pelvis was performed using the standard protocol following bolus administration of intravenous contrast. CONTRAST:  ISOVUE-300 IOPAMIDOL (ISOVUE-300) INJECTION 61% COMPARISON:  Right upper quadrant ultrasound today. FINDINGS: Lung bases demonstrate mild dependent atelectasis in the right base. Abdominal images demonstrate moderate cholelithiasis. The liver, spleen, pancreas and adrenal glands are within normal. The kidneys normal in size without hydronephrosis or nephrolithiasis. Ureters are within normal. There is mild calcified  plaque over the abdominal aorta and iliac arteries. There is moderate diverticulosis of the colon. There is an inflamed diverticula with moderate adjacent inflammation and free fluid in the pericolonic fat involving the hepatic flexure in the right mid abdomen just below the liver. Possible subtle focus of free air adjacent the inflamed diverticula as cannot exclude a subtle contained local perforation. No evidence abscess. There is a second less severe site of acute diverticulitis involving the distal descending colon without perforation or abscess. There is a small umbilical hernia containing only peritoneal fat. Remaining pelvic images demonstrate surgical absence of the uterus. There is a small amount of free fluid. The bladder and rectum are normal. There is minimal degenerate change of the spine. IMPRESSION: Colonic diverticulosis with moderate acute diverticulitis involving the hepatic flexure. Second site of less severe acute diverticulitis involving the distal descending colon. Cannot completely exclude subtle focal contained perforation adjacent the inflamed diverticula over the hepatic flexure. No evidence abscess. Moderate cholelithiasis. Umbilical hernia containing only peritoneal fat. Electronically Signed   By: Elberta Fortis M.D.   On: 04/19/2016 19:10   US Abdomen Limited  04/19/2016  CLINICAL DATA:  Right  upper quadrant pain for the past 2 days; history of fatty liver disease EXAM: US ABDOMEN LIMITED - RIGHT UPPER QUADRANT COMPARISON:  Abdominal ultrasound dated December 02, 2010 FINDINGS: Gallbladder: The gallbladder is adequately distended. There are echogenic mobile shadowing stones. There is no gallbladder wall thickening, pericholecystic fluid, or positive sonographic Murphy's sign. Common bile duct: Diameter: 1 mm Liver: The hepatic echotexture is increased diffusely. There is no focal mass nor ductal dilation. The surface contour of the liver is normal. IMPRESSION: 1. Gallstones and sludge  without sonographic evidence of acute cholecystitis. 2. Fatty infiltrative change of the liver. Electronically Signed   By: Lossie Kalp  [place] M.D.   On: 04/19/2016 17:11     Assessment/Plan Principal Problem:   Acute diverticulitis Admit to MedSurg. Continue IV fluids. Continue analgesics as needed. Continue antiemetic as needed. Ciprofloxacin 400 mg IVP every 12 hours. Metronidazole 500 mg IVP every 8 hours. Consider surgical evaluation if no improvement.  Active Problems:   NASH (nonalcoholic steatohepatitis) Advised to cease weekend alcohol intake. Advised to decrease weight to improve condition and avoid hepatotoxic medications. Patient to follow up LFT''s  regularly with PCP.    Tobacco abuse disorder Recommended to consider smoking cessation. Declined nicotine replacement therapy.    UTI (lower urinary tract infection) Continue ciprofloxacin 400 mg IVP every 12 hours. Follow-up urine culture and sensitivity.    Hypokalemia Likely due to chronic diuretic use and diarrhea. Replacing through the IV fluids. Check magnesium level. Follow-up potassium level in a.m.    Hypomagnesemia Correcting with magnesium sulfate.    Hypophosphatemia Replacing.    Hypertension Stable at this time. Hold atenolol/chlorthalidone 50/25 mg. Metoprolol 5 mg IVP every 6 hours while NPO. Follow-up blood pressure.    Periorbital edema of left eye Continue IV antibiotics. Erythromycin ointment.      DVT prophylaxis: SCDs. Code Status: Full. Family Communication:  Disposition Plan: Admit for IVF and antibiotic therapy for 2-3 days.  Consults called:  Admission status: Inpatient/MedSurg.   Bobette Mo MD [facility] Pager [phone].  If 7PM-7AM, please contact night-coverage [email or web address] Password [email or web address]  04/19/2016, 8:02 PM

## 2016-04-19 NOTE — ED Notes (Signed)
She c/o ruq area abd. Pain x 2 days; plus decreased appetite.  She also c/o left eye/orbit area edema and erythema which she first noticed this morning.

## 2016-04-19 NOTE — ED Notes (Signed)
US at bedside

## 2016-04-20 DIAGNOSIS — R609 Edema, unspecified: Secondary | ICD-10-CM

## 2016-04-20 DIAGNOSIS — E876 Hypokalemia: Secondary | ICD-10-CM

## 2016-04-20 DIAGNOSIS — I10 Essential (primary) hypertension: Secondary | ICD-10-CM

## 2016-04-20 DIAGNOSIS — K5792 Diverticulitis of intestine, part unspecified, without perforation or abscess without bleeding: Secondary | ICD-10-CM

## 2016-04-20 DIAGNOSIS — K7581 Nonalcoholic steatohepatitis (NASH): Secondary | ICD-10-CM

## 2016-04-20 LAB — URINALYSIS, ROUTINE W REFLEX MICROSCOPIC
GLUCOSE, UA: NEGATIVE mg/dL
HGB URINE DIPSTICK: NEGATIVE
KETONES UR: NEGATIVE mg/dL
Nitrite: POSITIVE — AB
PH: 5.5 (ref 5.0–8.0)
PROTEIN: NEGATIVE mg/dL
SPECIFIC GRAVITY, URINE: 1.024 (ref 1.005–1.030)

## 2016-04-20 LAB — CBC WITH DIFFERENTIAL/PLATELET
BASOS PCT: 0 %
Basophils Absolute: 0 10*3/uL (ref 0.0–0.1)
EOS ABS: 0.1 10*3/uL (ref 0.0–0.7)
Eosinophils Relative: 1 %
HCT: 36.9 % (ref 36.0–46.0)
HEMOGLOBIN: 12.6 g/dL (ref 12.0–15.0)
Lymphocytes Relative: 20 %
Lymphs Abs: 2.6 10*3/uL (ref 0.7–4.0)
MCH: 31 pg (ref 26.0–34.0)
MCHC: 34.1 g/dL (ref 30.0–36.0)
MCV: 90.7 fL (ref 78.0–100.0)
MONO ABS: 1 10*3/uL (ref 0.1–1.0)
Monocytes Relative: 8 %
NEUTROS PCT: 71 %
Neutro Abs: 9.2 10*3/uL — ABNORMAL HIGH (ref 1.7–7.7)
Platelets: 114 10*3/uL — ABNORMAL LOW (ref 150–400)
RBC: 4.07 MIL/uL (ref 3.87–5.11)
RDW: 17.8 % — ABNORMAL HIGH (ref 11.5–15.5)
WBC: 12.9 10*3/uL — AB (ref 4.0–10.5)

## 2016-04-20 LAB — COMPREHENSIVE METABOLIC PANEL
ALBUMIN: 3.8 g/dL (ref 3.5–5.0)
ALK PHOS: 47 U/L (ref 38–126)
ALT: 42 U/L (ref 14–54)
ANION GAP: 10 (ref 5–15)
AST: 52 U/L — ABNORMAL HIGH (ref 15–41)
BUN: 12 mg/dL (ref 6–20)
CALCIUM: 8.4 mg/dL — AB (ref 8.9–10.3)
CHLORIDE: 96 mmol/L — AB (ref 101–111)
CO2: 25 mmol/L (ref 22–32)
Creatinine, Ser: 0.95 mg/dL (ref 0.44–1.00)
GFR calc Af Amer: >60 mL/min (ref >60)
GFR calc non Af Amer: >60 mL/min (ref >60)
GLUCOSE: 116 mg/dL — AB (ref 65–99)
POTASSIUM: 3.2 mmol/L — AB (ref 3.5–5.1)
SODIUM: 131 mmol/L — AB (ref 135–145)
Total Bilirubin: 2.4 mg/dL — ABNORMAL HIGH (ref 0.3–1.2)
Total Protein: 7.3 g/dL (ref 6.5–8.1)

## 2016-04-20 LAB — URINE MICROSCOPIC-ADD ON

## 2016-04-20 MED ORDER — ENOXAPARIN SODIUM 40 MG/0.4ML ~~LOC~~ SOLN
40.0000 mg | SUBCUTANEOUS | Status: DC
  Administered 2016-04-20 – 2016-04-21 (×2): 40 mg via SUBCUTANEOUS
  Filled 2016-04-20 (×2): qty 0.4

## 2016-04-20 MED ORDER — FAMOTIDINE IN NACL 20-0.9 MG/50ML-% IV SOLN
20.0000 mg | Freq: Two times a day (BID) | INTRAVENOUS | Status: DC
  Administered 2016-04-20 – 2016-04-21 (×3): 20 mg via INTRAVENOUS
  Filled 2016-04-20 (×3): qty 50

## 2016-04-20 MED ORDER — MAGNESIUM SULFATE 2 GM/50ML IV SOLN
2.0000 g | Freq: Once | INTRAVENOUS | Status: AC
  Administered 2016-04-20: 2 g via INTRAVENOUS
  Filled 2016-04-20: qty 50

## 2016-04-20 MED ORDER — DIPHENHYDRAMINE HCL 50 MG/ML IJ SOLN
25.0000 mg | Freq: Four times a day (QID) | INTRAMUSCULAR | Status: DC
  Administered 2016-04-20 – 2016-04-22 (×8): 25 mg via INTRAVENOUS
  Filled 2016-04-20 (×5): qty 1
  Filled 2016-04-20: qty 0.5
  Filled 2016-04-20: qty 1
  Filled 2016-04-20 (×3): qty 0.5
  Filled 2016-04-20: qty 1
  Filled 2016-04-20 (×4): qty 0.5

## 2016-04-20 MED ORDER — OXYCODONE HCL 5 MG PO TABS
5.0000 mg | ORAL_TABLET | ORAL | Status: DC | PRN
  Administered 2016-04-20 – 2016-04-22 (×5): 5 mg via ORAL
  Filled 2016-04-20 (×5): qty 1

## 2016-04-20 MED ORDER — METOPROLOL TARTRATE 5 MG/5ML IV SOLN
5.0000 mg | Freq: Four times a day (QID) | INTRAVENOUS | Status: DC
  Administered 2016-04-21 – 2016-04-22 (×2): 5 mg via INTRAVENOUS
  Filled 2016-04-20 (×11): qty 5

## 2016-04-20 MED ORDER — POTASSIUM CHLORIDE 20 MEQ/15ML (10%) PO SOLN
40.0000 meq | Freq: Once | ORAL | Status: AC
  Administered 2016-04-20: 40 meq via ORAL
  Filled 2016-04-20: qty 30

## 2016-04-20 NOTE — Progress Notes (Signed)
PROGRESS NOTE    Candice Roy  RUE:[id number] DOB: [birth year] DOA: 04/19/2016  PCP: [facility]     Brief Narrative:  Candice Roy is a 60 y.o. female with medical history significant of hypertension, fatty liver disease, LS spine bulging disc comes to the emergency department complaints of RUQ abdominal pain, nausea and decreased appetite. She also complains of left periorbital area non-tender/nonpruritic edema and erythema associated with whitish discharge.  Presents with 2 days of RUQ pain, diarrhea and nausea. She also developed swelling of the left eye until it was swollen shut.   Subjective: Abdominal pain not as severe. No BMs since being admitted. Nausea is mild.   Assessment & Plan:   Principal Problem:   Acute diverticulitis - Cipro, flagyl- start clear liquids today- cont IVF - cont Dilaudid for severe pain- add Oxycodone for moderate pain   Active Problems:   Periorbital edema of left eye -was swollen shut per patient- now able to open  -  likely allergic- no conjunctival injection- stop erythromycin ointment- start Benadryl and Pepcid     Hypomagnesemia   Hypophosphatemia   Hypokalemia - replacing    NASH (nonalcoholic steatohepatitis) - needs to lose weight to help resolve this    UTI (lower urinary tract infection)? - asymptomatic- no need to treat    Hypertension - Atenolol/ Chlorthalidone held as she was NPO - on Metoprolol IV with holding parameters   DVT prophylaxis: Lovenox Code Status: full Family Communication:  Disposition Plan: home in 1-2 days Consultants:   none Procedures:   none Antimicrobials:  Anti-infectives    Start     Dose/Rate Route Frequency Ordered Stop   04/20/16 0800  ciprofloxacin (CIPRO) IVPB 400 mg     400 mg 200 mL/hr over 60 Minutes Intravenous Every 12 hours 04/19/16 2110     04/19/16 2200  metroNIDAZOLE (FLAGYL) IVPB 500 mg     500 mg 100 mL/hr over 60 Minutes Intravenous Every 8 hours  04/19/16 2110     04/19/16 2000  ciprofloxacin (CIPRO) IVPB 400 mg     400 mg 200 mL/hr over 60 Minutes Intravenous  Once 04/19/16 1954 04/19/16 2138   04/19/16 2000  metroNIDAZOLE (FLAGYL) IVPB 500 mg  Status:  Discontinued     500 mg 100 mL/hr over 60 Minutes Intravenous  Once 04/19/16 1954 04/19/16 2119       Objective: Filed Vitals:   04/19/16 2111 04/20/16 0510 04/20/16 1207 04/20/16 1416  BP: 113/69 123/69 119/65 106/66  Pulse: 93 91 88 81  Temp: 99.7 F (37.6 C) 99.6 F (37.6 C)  98.8 F (37.1 C)  TempSrc: Oral Oral  Oral  Resp: 20 22  20   Height: 5' 8.5" (1.74 m)     Weight: 84.369 kg (186 lb)     SpO2: 93% 91%  94%   No intake or output data in the 24 hours ending 04/20/16 1430 Filed Weights   04/19/16 2111  Weight: 84.369 kg (186 lb)    Examination: General exam: Appears comfortable  HEENT: PERRLA, oral mucosa moist, no sclera icterus or thrush Respiratory system: Clear to auscultation. Respiratory effort normal. Cardiovascular system: S1 & S2 heard, RRR.  No murmurs  Gastrointestinal system: Abdomen soft,  tender in RUQ and epigastrium, nondistended. Normal bowel sound. No organomegaly Central nervous system: Alert and oriented. No focal neurological deficits. Extremities: No cyanosis, clubbing or edema Skin: No rashes or ulcers Psychiatry:  Mood & affect appropriate.  Data Reviewed: I have personally reviewed following labs and imaging studies  CBC:  Recent Labs Lab 04/19/16 1432 04/20/16 0538  WBC 12.6* 12.9*  NEUTROABS  --  9.2*  HGB 14.1 12.6  HCT 40.2 36.9  MCV 89.5 90.7  PLT 145* 114*   Basic Metabolic Panel:  Recent Labs Lab 04/19/16 1432 04/19/16 1437 04/20/16 0538  NA 136  --  131*  K 3.2*  --  3.2*  CL 99*  --  96*  CO2 24  --  25  GLUCOSE 135*  --  116*  BUN 12  --  12  CREATININE 0.82  --  0.95  CALCIUM 9.4  --  8.4*  MG  --  1.2*  --   PHOS  --  2.3*  --    GFR: Estimated Creatinine Clearance: 71.5 mL/min (by  C-G formula based on Cr of 0.95). Liver Function Tests:  Recent Labs Lab 04/19/16 1432 04/20/16 0538  AST 72* 52*  ALT 54 42  ALKPHOS 57 47  BILITOT 2.7* 2.4*  PROT 8.5* 7.3  ALBUMIN 4.6 3.8    Recent Labs Lab 04/19/16 1432  LIPASE 17   No results for input(s): AMMONIA in the last 168 hours. Coagulation Profile: No results for input(s): INR, PROTIME in the last 168 hours. Cardiac Enzymes: No results for input(s): CKTOTAL, CKMB, CKMBINDEX, TROPONINI in the last 168 hours. BNP (last 3 results) No results for input(s): PROBNP in the last 8760 hours. HbA1C: No results for input(s): HGBA1C in the last 72 hours. CBG: No results for input(s): GLUCAP in the last 168 hours. Lipid Profile: No results for input(s): CHOL, HDL, LDLCALC, TRIG, CHOLHDL, LDLDIRECT in the last 72 hours. Thyroid Function Tests: No results for input(s): TSH, T4TOTAL, FREET4, T3FREE, THYROIDAB in the last 72 hours. Anemia Panel: No results for input(s): VITAMINB12, FOLATE, FERRITIN, TIBC, IRON, RETICCTPCT in the last 72 hours. Urine analysis:    Component Value Date/Time   COLORURINE ORANGE* 04/19/2016 1711   APPEARANCEUR CLOUDY* 04/19/2016 1711   LABSPEC 1.026 04/19/2016 1711   PHURINE 6.0 04/19/2016 1711   GLUCOSEU NEGATIVE 04/19/2016 1711   HGBUR NEGATIVE 04/19/2016 1711   BILIRUBINUR MODERATE* 04/19/2016 1711   KETONESUR 15* 04/19/2016 1711   PROTEINUR NEGATIVE 04/19/2016 1711   UROBILINOGEN 1.0 01/28/2015 0120   NITRITE POSITIVE* 04/19/2016 1711   LEUKOCYTESUR MODERATE* 04/19/2016 1711   Sepsis Labs: (procalcitonin:4,lacticidven:4)  )No results found for this or any previous visit (from the past 240 hour(s)).       Radiology Studies: Ct Abdomen Pelvis W Contrast  04/19/2016  CLINICAL DATA:  Lower abdominal pain 1 week. Previous appendectomy and hysterectomy. EXAM: CT ABDOMEN AND PELVIS WITH CONTRAST TECHNIQUE: Multidetector CT imaging of the abdomen and pelvis was performed  using the standard protocol following bolus administration of intravenous contrast. CONTRAST:  ISOVUE-300 IOPAMIDOL (ISOVUE-300) INJECTION 61% COMPARISON:  Right upper quadrant ultrasound today. FINDINGS: Lung bases demonstrate mild dependent atelectasis in the right base. Abdominal images demonstrate moderate cholelithiasis. The liver, spleen, pancreas and adrenal glands are within normal. The kidneys normal in size without hydronephrosis or nephrolithiasis. Ureters are within normal. There is mild calcified plaque over the abdominal aorta and iliac arteries. There is moderate diverticulosis of the colon. There is an inflamed diverticula with moderate adjacent inflammation and free fluid in the pericolonic fat involving the hepatic flexure in the right mid abdomen just below the liver. Possible subtle focus of free air adjacent the inflamed diverticula as cannot exclude a subtle  contained local perforation. No evidence abscess. There is a second less severe site of acute diverticulitis involving the distal descending colon without perforation or abscess. There is a small umbilical hernia containing only peritoneal fat. Remaining pelvic images demonstrate surgical absence of the uterus. There is a small amount of free fluid. The bladder and rectum are normal. There is minimal degenerate change of the spine. IMPRESSION: Colonic diverticulosis with moderate acute diverticulitis involving the hepatic flexure. Second site of less severe acute diverticulitis involving the distal descending colon. Cannot completely exclude subtle focal contained perforation adjacent the inflamed diverticula over the hepatic flexure. No evidence abscess. Moderate cholelithiasis. Umbilical hernia containing only peritoneal fat. Electronically Signed   By: Elberta Fortisaniel  Boyle M.D.   On: 04/19/2016 19:10   [place] Abdomen Limited  04/19/2016  CLINICAL DATA:  Right upper quadrant pain for the past 2 days; history of fatty liver disease EXAM: US  ABDOMEN LIMITED - RIGHT UPPER QUADRANT COMPARISON:  Abdominal ultrasound dated December 02, 2010 FINDINGS: Gallbladder: The gallbladder is adequately distended. There are echogenic mobile shadowing stones. There is no gallbladder wall thickening, pericholecystic fluid, or positive sonographic Murphy's sign. Common bile duct: Diameter: 1 mm Liver: The hepatic echotexture is increased diffusely. There is no focal mass nor ductal dilation. The surface contour of the liver is normal. IMPRESSION: 1. Gallstones and sludge without sonographic evidence of acute cholecystitis. 2. Fatty infiltrative change of the liver. Electronically Signed   By: David  [place] M.D.   On: 04/19/2016 17:11        Scheduled Meds: . ciprofloxacin  400 mg Intravenous Q12H  . diphenhydrAMINE  25 mg Intravenous Q6H  . enoxaparin (LOVENOX) injection  40 mg Subcutaneous Q24H  . famotidine (PEPCID) IV  20 mg Intravenous Q12H  .  HYDROmorphone (DILAUDID) injection  1 mg Intravenous Once  . metoprolol  5 mg Intravenous Q6H  . metronidazole  500 mg Intravenous Q8H  . ondansetron  4 mg Intravenous Once  . pantoprazole (PROTONIX) IV  40 mg Intravenous Q24H   Continuous Infusions: . 0.9 % NaCl with KCl 20 mEq / L 100 mL/hr at 04/19/16 2158     LOS: 1 day    Time spent in minutes: 35    Tylea Hise, MD [facility] Pager: [email or web address] Password [email or web address] 04/20/2016, 2:30 PM

## 2016-04-21 DIAGNOSIS — Z720 Tobacco use: Secondary | ICD-10-CM

## 2016-04-21 LAB — COMPREHENSIVE METABOLIC PANEL
ALBUMIN: 3.4 g/dL — AB (ref 3.5–5.0)
ALT: 30 U/L (ref 14–54)
ANION GAP: 7 (ref 5–15)
AST: 35 U/L (ref 15–41)
Alkaline Phosphatase: 46 U/L (ref 38–126)
BUN: 7 mg/dL (ref 6–20)
CHLORIDE: 99 mmol/L — AB (ref 101–111)
CO2: 23 mmol/L (ref 22–32)
Calcium: 7.5 mg/dL — ABNORMAL LOW (ref 8.9–10.3)
Creatinine, Ser: 0.78 mg/dL (ref 0.44–1.00)
GFR calc Af Amer: >60 mL/min (ref >60)
GFR calc non Af Amer: >60 mL/min (ref >60)
GLUCOSE: 116 mg/dL — AB (ref 65–99)
POTASSIUM: 3.5 mmol/L (ref 3.5–5.1)
SODIUM: 129 mmol/L — AB (ref 135–145)
TOTAL PROTEIN: 6.6 g/dL (ref 6.5–8.1)
Total Bilirubin: 1.7 mg/dL — ABNORMAL HIGH (ref 0.3–1.2)

## 2016-04-21 LAB — BASIC METABOLIC PANEL
ANION GAP: 7 (ref 5–15)
BUN: 7 mg/dL (ref 6–20)
CHLORIDE: 99 mmol/L — AB (ref 101–111)
CO2: 23 mmol/L (ref 22–32)
CREATININE: 0.86 mg/dL (ref 0.44–1.00)
Calcium: 7.5 mg/dL — ABNORMAL LOW (ref 8.9–10.3)
GFR calc non Af Amer: >60 mL/min (ref >60)
Glucose, Bld: 142 mg/dL — ABNORMAL HIGH (ref 65–99)
POTASSIUM: 3 mmol/L — AB (ref 3.5–5.1)
SODIUM: 129 mmol/L — AB (ref 135–145)

## 2016-04-21 LAB — CBC WITH DIFFERENTIAL/PLATELET
BASOS ABS: 0 10*3/uL (ref 0.0–0.1)
BASOS PCT: 0 %
EOS ABS: 0.2 10*3/uL (ref 0.0–0.7)
Eosinophils Relative: 2 %
HCT: 30.9 % — ABNORMAL LOW (ref 36.0–46.0)
Hemoglobin: 10.5 g/dL — ABNORMAL LOW (ref 12.0–15.0)
Lymphocytes Relative: 20 %
Lymphs Abs: 2.2 10*3/uL (ref 0.7–4.0)
MCH: 30.4 pg (ref 26.0–34.0)
MCHC: 34 g/dL (ref 30.0–36.0)
MCV: 89.6 fL (ref 78.0–100.0)
MONO ABS: 1.2 10*3/uL — AB (ref 0.1–1.0)
MONOS PCT: 11 %
NEUTROS ABS: 7.3 10*3/uL (ref 1.7–7.7)
NEUTROS PCT: 67 %
Platelets: 88 10*3/uL — ABNORMAL LOW (ref 150–400)
RBC: 3.45 MIL/uL — ABNORMAL LOW (ref 3.87–5.11)
RDW: 17.5 % — AB (ref 11.5–15.5)
WBC: 10.9 10*3/uL — ABNORMAL HIGH (ref 4.0–10.5)

## 2016-04-21 LAB — MAGNESIUM: Magnesium: 2.2 mg/dL (ref 1.7–2.4)

## 2016-04-21 LAB — PHOSPHORUS: Phosphorus: 2 mg/dL — ABNORMAL LOW (ref 2.5–4.6)

## 2016-04-21 MED ORDER — POTASSIUM CHLORIDE 20 MEQ/15ML (10%) PO SOLN
40.0000 meq | ORAL | Status: AC
  Administered 2016-04-21 (×2): 40 meq via ORAL
  Filled 2016-04-21 (×2): qty 30

## 2016-04-21 MED ORDER — DICLOFENAC SODIUM 1 % TD GEL
2.0000 g | Freq: Four times a day (QID) | TRANSDERMAL | Status: DC
  Administered 2016-04-21 – 2016-04-22 (×5): 2 g via TOPICAL
  Filled 2016-04-21: qty 100

## 2016-04-21 MED ORDER — HYDROCORTISONE 1 % EX CREA
1.0000 "application " | TOPICAL_CREAM | Freq: Three times a day (TID) | CUTANEOUS | Status: DC | PRN
  Filled 2016-04-21: qty 28

## 2016-04-21 MED ORDER — SODIUM CHLORIDE 0.9 % IV BOLUS (SEPSIS)
1000.0000 mL | Freq: Once | INTRAVENOUS | Status: AC
  Administered 2016-04-21: 1000 mL via INTRAVENOUS

## 2016-04-21 NOTE — Progress Notes (Signed)
PROGRESS NOTE    Candice EdwardsJackie O Roy  WUJ:[id number] DOB: July 09, 1956 DOA: 04/19/2016  [facility]     Brief Narrative:  Candice Roy is a 60 y.o. female with medical history significant of hypertension, fatty liver disease, LS spine bulging disc comes to the emergency department complaints of RUQ abdominal pain, nausea and decreased appetite. She also complains of left periorbital area non-tender/nonpruritic edema and erythema associated with whitish discharge.  Presents with 2 days of RUQ pain, diarrhea and nausea. She also developed swelling of the left eye until it was swollen shut.   Subjective: Abdominal pain now in right lower abdomen and only when moving around.  3 large liquids BMs yesterday. RUQ pain is improved. tolerating liquid diet.   Assessment & Plan:   Principal Problem:   Acute diverticulitis - Cipro, flagyl- advance to soft diet- cont IVF - cont Dilaudid for severe pain- add Oxycodone for moderate pain   Active Problems:   Periorbital edema of left eye -was swollen shut per patient- now able to open  -  likely allergic- no conjunctival injection- stop erythromycin ointment- start Benadryl and Pepcid- resolved  Dehydration / hyponatremia - due to large liquid BMS- cont IVF      Hypomagnesemia   Hypophosphatemia   Hypokalemia - replacing  Acute thrombocytopenia - 145 on admission and now 88- 198 in this past Feb - stop Pepcid and Lovenox  RLQ pain - muscular- voltaren gel     NASH (nonalcoholic steatohepatitis) - needs to lose weight to help resolve this    UTI (lower urinary tract infection)? - asymptomatic- no need to treat    Hypertension - Atenolol/ Chlorthalidone held as she was NPO - on Metoprolol IV with holding parameters   DVT prophylaxis: Lovenox Code Status: full Family Communication:  Disposition Plan: home in 1-2 days Consultants:   none Procedures:   none Antimicrobials:  Anti-infectives    Start      Dose/Rate Route Frequency Ordered Stop   04/20/16 0800  ciprofloxacin (CIPRO) IVPB 400 mg     400 mg 200 mL/hr over 60 Minutes Intravenous Every 12 hours 04/19/16 2110     04/19/16 2200  metroNIDAZOLE (FLAGYL) IVPB 500 mg     500 mg 100 mL/hr over 60 Minutes Intravenous Every 8 hours 04/19/16 2110     04/19/16 2000  ciprofloxacin (CIPRO) IVPB 400 mg     400 mg 200 mL/hr over 60 Minutes Intravenous  Once 04/19/16 1954 04/19/16 2138   04/19/16 2000  metroNIDAZOLE (FLAGYL) IVPB 500 mg  Status:  Discontinued     500 mg 100 mL/hr over 60 Minutes Intravenous  Once 04/19/16 1954 04/19/16 2119       Objective: Filed Vitals:   04/20/16 1416 04/20/16 2137 04/21/16 0706 04/21/16 1326  BP: 106/66 106/60 108/70 114/63  Pulse: 81 88 84 89  Temp: 98.8 F (37.1 C) 99.8 F (37.7 C) 98.7 F (37.1 C) 99 F (37.2 C)  TempSrc: Oral Oral Oral Oral  Resp: 20 20 20 20   Height:      Weight:      SpO2: 94% 94% 93% 96%    Intake/Output Summary (Last 24 hours) at 04/21/16 1338 Last data filed at 04/20/16 2120  Gross per 24 hour  Intake   1762 ml  Output    200 ml  Net   1562 ml   Filed Weights   04/19/16 2111  Weight: 84.369 kg (186 lb)    Examination: General  exam: Appears comfortable  HEENT: PERRLA, oral mucosa moist, no sclera icterus or thrush Respiratory system: Clear to auscultation. Respiratory effort normal. Cardiovascular system: S1 & S2 heard, RRR.  No murmurs  Gastrointestinal system: Abdomen soft,  mildly tender in RUQ and epigastrium, more tender in RLQ- nondistended. Normal bowel sound. No organomegaly Central nervous system: Alert and oriented. No focal neurological deficits. Extremities: No cyanosis, clubbing or edema Skin: No rashes or ulcers Psychiatry:  Mood & affect appropriate.     Data Reviewed: I have personally reviewed following labs and imaging studies  CBC:  Recent Labs Lab 04/19/16 1432 04/20/16 0538 04/21/16 0539  WBC 12.6* 12.9* 10.9*  NEUTROABS   --  9.2* 7.3  HGB 14.1 12.6 10.5*  HCT 40.2 36.9 30.9*  MCV 89.5 90.7 89.6  PLT 145* 114* 88*   Basic Metabolic Panel:  Recent Labs Lab 04/19/16 1432 04/19/16 1437 04/20/16 0538 04/21/16 0539 04/21/16 0900  NA 136  --  131* 129* 129*  K 3.2*  --  3.2* 3.5 3.0*  CL 99*  --  96* 99* 99*  CO2 24  --  25 23 23   GLUCOSE 135*  --  116* 116* 142*  BUN 12  --  12 7 7   CREATININE 0.82  --  0.95 0.78 0.86  CALCIUM 9.4  --  8.4* 7.5* 7.5*  MG  --  1.2*  --  2.2  --   PHOS  --  2.3*  --  2.0*  --    GFR: Estimated Creatinine Clearance: 78.9 mL/min (by C-G formula based on Cr of 0.86). Liver Function Tests:  Recent Labs Lab 04/19/16 1432 04/20/16 0538 04/21/16 0539  AST 72* 52* 35  ALT 54 42 30  ALKPHOS 57 47 46  BILITOT 2.7* 2.4* 1.7*  PROT 8.5* 7.3 6.6  ALBUMIN 4.6 3.8 3.4*    Recent Labs Lab 04/19/16 1432  LIPASE 17   No results for input(s): AMMONIA in the last 168 hours. Coagulation Profile: No results for input(s): INR, PROTIME in the last 168 hours. Cardiac Enzymes: No results for input(s): CKTOTAL, CKMB, CKMBINDEX, TROPONINI in the last 168 hours. BNP (last 3 results) No results for input(s): PROBNP in the last 8760 hours. HbA1C: No results for input(s): HGBA1C in the last 72 hours. CBG: No results for input(s): GLUCAP in the last 168 hours. Lipid Profile: No results for input(s): CHOL, HDL, LDLCALC, TRIG, CHOLHDL, LDLDIRECT in the last 72 hours. Thyroid Function Tests: No results for input(s): TSH, T4TOTAL, FREET4, T3FREE, THYROIDAB in the last 72 hours. Anemia Panel: No results for input(s): VITAMINB12, FOLATE, FERRITIN, TIBC, IRON, RETICCTPCT in the last 72 hours. Urine analysis:    Component Value Date/Time   COLORURINE ORANGE* 04/20/2016 2136   APPEARANCEUR CLOUDY* 04/20/2016 2136   LABSPEC 1.024 04/20/2016 2136   PHURINE 5.5 04/20/2016 2136   GLUCOSEU NEGATIVE 04/20/2016 2136   HGBUR NEGATIVE 04/20/2016 2136   BILIRUBINUR SMALL* 04/20/2016  2136   KETONESUR NEGATIVE 04/20/2016 2136   PROTEINUR NEGATIVE 04/20/2016 2136   UROBILINOGEN 1.0 01/28/2015 0120   NITRITE POSITIVE* 04/20/2016 2136   LEUKOCYTESUR SMALL* 04/20/2016 2136   Sepsis Labs: @LABRCNTIP (procalcitonin:4,lacticidven:4)  )No results found for this or any previous visit (from the past 240 hour(s)).       Radiology Studies: Ct Abdomen Pelvis W Contrast  04/19/2016  CLINICAL DATA:  Lower abdominal pain 1 week. Previous appendectomy and hysterectomy. EXAM: CT ABDOMEN AND PELVIS WITH CONTRAST TECHNIQUE: Multidetector CT imaging of the abdomen and pelvis  was performed using the standard protocol following bolus administration of intravenous contrast. CONTRAST:  ISOVUE-300 IOPAMIDOL (ISOVUE-300) INJECTION 61% COMPARISON:  Right upper quadrant ultrasound today. FINDINGS: Lung bases demonstrate mild dependent atelectasis in the right base. Abdominal images demonstrate moderate cholelithiasis. The liver, spleen, pancreas and adrenal glands are within normal. The kidneys normal in size without hydronephrosis or nephrolithiasis. Ureters are within normal. There is mild calcified plaque over the abdominal aorta and iliac arteries. There is moderate diverticulosis of the colon. There is an inflamed diverticula with moderate adjacent inflammation and free fluid in the pericolonic fat involving the hepatic flexure in the right mid abdomen just below the liver. Possible subtle focus of free air adjacent the inflamed diverticula as cannot exclude a subtle contained local perforation. No evidence abscess. There is a second less severe site of acute diverticulitis involving the distal descending colon without perforation or abscess. There is a small umbilical hernia containing only peritoneal fat. Remaining pelvic images demonstrate surgical absence of the uterus. There is a small amount of free fluid. The bladder and rectum are normal. There is minimal degenerate change of the spine.  IMPRESSION: Colonic diverticulosis with moderate acute diverticulitis involving the hepatic flexure. Second site of less severe acute diverticulitis involving the distal descending colon. Cannot completely exclude subtle focal contained perforation adjacent the inflamed diverticula over the hepatic flexure. No evidence abscess. Moderate cholelithiasis. Umbilical hernia containing only peritoneal fat. Electronically Signed   By: Elberta Fortis M.D.   On: 04/19/2016 19:10   [place] Abdomen Limited  04/19/2016  CLINICAL DATA:  Right upper quadrant pain for the past 2 days; history of fatty liver disease EXAM: US ABDOMEN LIMITED - RIGHT UPPER QUADRANT COMPARISON:  Abdominal ultrasound dated December 02, 2010 FINDINGS: Gallbladder: The gallbladder is adequately distended. There are echogenic mobile shadowing stones. There is no gallbladder wall thickening, pericholecystic fluid, or positive sonographic Murphy's sign. Common bile duct: Diameter: 1 mm Liver: The hepatic echotexture is increased diffusely. There is no focal mass nor ductal dilation. The surface contour of the liver is normal. IMPRESSION: 1. Gallstones and sludge without sonographic evidence of acute cholecystitis. 2. Fatty infiltrative change of the liver. Electronically Signed   By: David  [place] M.D.   On: 04/19/2016 17:11        Scheduled Meds: . ciprofloxacin  400 mg Intravenous Q12H  . diclofenac sodium  2 g Topical QID  . diphenhydrAMINE  25 mg Intravenous Q6H  . enoxaparin (LOVENOX) injection  40 mg Subcutaneous Q24H  . famotidine (PEPCID) IV  20 mg Intravenous Q12H  .  HYDROmorphone (DILAUDID) injection  1 mg Intravenous Once  . metoprolol  5 mg Intravenous Q6H  . metronidazole  500 mg Intravenous Q8H  . ondansetron  4 mg Intravenous Once  . pantoprazole (PROTONIX) IV  40 mg Intravenous Q24H  . potassium chloride  40 mEq Oral Q4H   Continuous Infusions: . 0.9 % NaCl with KCl 20 mEq / L 100 mL/hr at 04/20/16 1900     LOS: 2 days      Time spent in minutes: 35    Debrina Kizer, MD [facility] Pager: [email or web address] Password [email or web address] 04/21/2016, 1:38 PM

## 2016-04-22 LAB — CBC WITH DIFFERENTIAL/PLATELET
BASOS PCT: 0 %
Basophils Absolute: 0 10*3/uL (ref 0.0–0.1)
EOS ABS: 0.2 10*3/uL (ref 0.0–0.7)
EOS PCT: 3 %
HCT: 31.2 % — ABNORMAL LOW (ref 36.0–46.0)
Hemoglobin: 10.5 g/dL — ABNORMAL LOW (ref 12.0–15.0)
Lymphocytes Relative: 22 %
Lymphs Abs: 1.5 10*3/uL (ref 0.7–4.0)
MCH: 30.9 pg (ref 26.0–34.0)
MCHC: 33.7 g/dL (ref 30.0–36.0)
MCV: 91.8 fL (ref 78.0–100.0)
MONO ABS: 0.9 10*3/uL (ref 0.1–1.0)
MONOS PCT: 13 %
NEUTROS PCT: 62 %
Neutro Abs: 4.4 10*3/uL (ref 1.7–7.7)
PLATELETS: 110 10*3/uL — AB (ref 150–400)
RBC: 3.4 MIL/uL — ABNORMAL LOW (ref 3.87–5.11)
RDW: 17.4 % — AB (ref 11.5–15.5)
WBC: 7 10*3/uL (ref 4.0–10.5)

## 2016-04-22 LAB — COMPREHENSIVE METABOLIC PANEL
ALT: 26 U/L (ref 14–54)
AST: 31 U/L (ref 15–41)
Albumin: 3.2 g/dL — ABNORMAL LOW (ref 3.5–5.0)
Alkaline Phosphatase: 47 U/L (ref 38–126)
Anion gap: 5 (ref 5–15)
BILIRUBIN TOTAL: 1.4 mg/dL — AB (ref 0.3–1.2)
BUN: 6 mg/dL (ref 6–20)
CHLORIDE: 103 mmol/L (ref 101–111)
CO2: 24 mmol/L (ref 22–32)
Calcium: 7.8 mg/dL — ABNORMAL LOW (ref 8.9–10.3)
Creatinine, Ser: 0.71 mg/dL (ref 0.44–1.00)
GFR calc Af Amer: >60 mL/min (ref >60)
GFR calc non Af Amer: >60 mL/min (ref >60)
GLUCOSE: 88 mg/dL (ref 65–99)
POTASSIUM: 4.1 mmol/L (ref 3.5–5.1)
SODIUM: 132 mmol/L — AB (ref 135–145)
TOTAL PROTEIN: 6.5 g/dL (ref 6.5–8.1)

## 2016-04-22 MED ORDER — DIPHENHYDRAMINE HCL 50 MG PO TABS: 25.0000 mg | ORAL_TABLET | Freq: Three times a day (TID) | ORAL | Status: DC

## 2016-04-22 MED ORDER — FAMOTIDINE 20 MG PO TABS: 20.0000 mg | ORAL_TABLET | Freq: Two times a day (BID) | ORAL | Status: DC

## 2016-04-22 MED ORDER — CIPROFLOXACIN HCL 500 MG PO TABS: 500.0000 mg | ORAL_TABLET | Freq: Two times a day (BID) | ORAL | Status: DC

## 2016-04-22 MED ORDER — LOPERAMIDE HCL 2 MG PO TABS: 2.0000 mg | ORAL_TABLET | Freq: Four times a day (QID) | ORAL | Status: DC | PRN

## 2016-04-22 MED ORDER — METRONIDAZOLE 500 MG PO TABS: 500.0000 mg | ORAL_TABLET | Freq: Three times a day (TID) | ORAL | Status: DC

## 2016-04-22 NOTE — Discharge Summary (Signed)
Physician Discharge Summary  LYNNETTA TOM Roy:[id number] DOB: 1956/04/05 DOA: 04/19/2016  PCP: [facility]  Admit date: 04/19/2016 Discharge date: 04/22/2016  Time spent: 55 minutes  Recommendations for Outpatient Follow-up:  1. F/u CBC in 3-4 days 2. avoid H2 blockers   3. F/u left periorbital edema which is likely allergic 4. BP check in 3-4 days  Discharge Condition: stable    Discharge Diagnoses:  Principal Problem:   Acute diverticulitis Active Problems:   Periorbital edema of left eye   NASH (nonalcoholic steatohepatitis) with elevated LFTs   Tobacco abuse disorder   Hypokalemia   Hypertension   Hypomagnesemia   Hypophosphatemia   History of present illness:  Candice Roy is a 60 y.o. female with medical history significant of hypertension, fatty liver disease, LS spine bulging disc comes to the emergency department complaints of RUQ abdominal pain, nausea and decreased appetite for 2 days. Found to have diverticulitis of hepatic flexure with leukocytosis.   She also complains of left periorbital area non-tender/nonpruritic edema and erythema associated with whitish discharge.   Hospital Course:  Principal Problem:  Acute diverticulitis - CT showing diverticulitis of hepatic flexure- RUQ pain has resolved -- advanced to soft diet which she has been tolerating since yesterday - not requiring any pain medications - will d/c home with oral Cipro and Flagyl  Active Problems:  Periorbital edema of left eye -was swollen shut per patient- now able to open  - likely allergic- no conjunctival injection - stopped erythromycin ointment- started around the clock 25 mg Benadryl and Pepcid howver as platelets dropped, Pepcid was discontinued - the swelling has greatly improved- recommend cont Benadryl and f/u with PCP in 3-4 days  Dehydration / hyponatremia - resolved with aggressive hydration and Lomotil    Hypomagnesemia  Hypophosphatemia   Hypokalemia - replaced  Acute thrombocytopenia - 145 on admission -nadir 88 on 5/19 and 110 today- stopped Lovenox and Pepcid -- 198 in this past Feb - stop Pepcid and Lovenox - please recheck in 3-4 days  Anemia - slow progressive drop in Hb from 14 to 10  RLQ pain - muscular- voltaren gel has helped it   NASH (nonalcoholic steatohepatitis) with elevated LFTs - needs to lose weight to help resolve this   UTI (lower urinary tract infection)? - asymptomatic- no need to treat   Hypertension - Atenolol/ Chlorthalidone held as she was and continues to be slightly hypotensive   Procedures:  none  Consultations:  none  Discharge Exam: Filed Weights   04/19/16 2111  Weight: 84.369 kg (186 lb)   Filed Vitals:   04/21/16 2206 04/22/16 0546  BP: 117/78 112/63  Pulse: 81 82  Temp: 98.7 F (37.1 C) 98.4 F (36.9 C)  Resp: 18 16    General: AAO x 3, no distress Cardiovascular: RRR, no murmurs  Respiratory: clear to auscultation bilaterally GI: soft, non-tender, non-distended, bowel sound positive  Discharge Instructions You were cared for by a hospitalist during your hospital stay. If you have any questions about your discharge medications or the care you received while you were in the hospital after you are discharged, you can call the unit and asked to speak with the hospitalist on call if the hospitalist that took care of you is not available. Once you are discharged, your primary care physician will handle any further medical issues. Please note that NO REFILLS for any discharge medications will be authorized once you are discharged, as it is imperative that you  return to your primary care physician (or establish a relationship with a primary care physician if you do not have one) for your aftercare needs so that they can reassess your need for medications and monitor your lab values.      Discharge Instructions    Discharge instructions    Complete by:  As  directed   Low salt and low fiber diet  Take Benadryl every 8 hrs for swelling of your left eye. See your doctor in 3-4 days to see if it can be stopped. Have your BP checked, CBC and Bmet (bloodwork) done in 3-4 days and ask PCP to address your BP medication     Increase activity slowly    Complete by:  As directed             Medication List    STOP taking these medications        atenolol-chlorthalidone 50-25 MG tablet  Commonly known as:  TENORETIC      TAKE these medications        albuterol 108 (90 Base) MCG/ACT inhaler  Commonly known as:  PROVENTIL HFA;VENTOLIN HFA  Inhale 1-2 puffs into the lungs every 6 (six) hours as needed for wheezing or shortness of breath.     ciprofloxacin 500 MG tablet  Commonly known as:  CIPRO  Take 1 tablet (500 mg total) by mouth 2 (two) times daily.     diphenhydrAMINE 50 MG tablet  Commonly known as:  BENADRYL  Take 0.5 tablets (25 mg total) by mouth every 8 (eight) hours.     GARLIC PO  Take 1 tablet by mouth daily.     loperamide 2 MG tablet  Commonly known as:  IMODIUM A-D  Take 1 tablet (2 mg total) by mouth 4 (four) times daily as needed for diarrhea or loose stools.     metroNIDAZOLE 500 MG tablet  Commonly known as:  FLAGYL  Take 1 tablet (500 mg total) by mouth 3 (three) times daily.     naproxen sodium 220 MG tablet  Commonly known as:  ANAPROX  Take 440 mg by mouth 2 (two) times daily as needed (for pain.).       Allergies  Allergen Reactions  . Aspirin Hives    Childhood allergy- can take alleve/ Naproxen  . Lisinopril Swelling   Follow-up Information    Follow up with [facility].   Why:  3-4 days   Contact information:   [address] [place] [address] [phone]        The results of significant diagnostics from this hospitalization (including imaging, microbiology, ancillary and laboratory) are listed below for reference.    Significant Diagnostic Studies: Ct  Abdomen Pelvis W Contrast  04/19/2016  CLINICAL DATA:  Lower abdominal pain 1 week. Previous appendectomy and hysterectomy. EXAM: CT ABDOMEN AND PELVIS WITH CONTRAST TECHNIQUE: Multidetector CT imaging of the abdomen and pelvis was performed using the standard protocol following bolus administration of intravenous contrast. CONTRAST:  ISOVUE-300 IOPAMIDOL (ISOVUE-300) INJECTION 61% COMPARISON:  Right upper quadrant ultrasound today. FINDINGS: Lung bases demonstrate mild dependent atelectasis in the right base. Abdominal images demonstrate moderate cholelithiasis. The liver, spleen, pancreas and adrenal glands are within normal. The kidneys normal in size without hydronephrosis or nephrolithiasis. Ureters are within normal. There is mild calcified plaque over the abdominal aorta and iliac arteries. There is moderate diverticulosis of the colon. There is an inflamed diverticula with moderate adjacent inflammation and free fluid  in the pericolonic fat involving the hepatic flexure in the right mid abdomen just below the liver. Possible subtle focus of free air adjacent the inflamed diverticula as cannot exclude a subtle contained local perforation. No evidence abscess. There is a second less severe site of acute diverticulitis involving the distal descending colon without perforation or abscess. There is a small umbilical hernia containing only peritoneal fat. Remaining pelvic images demonstrate surgical absence of the uterus. There is a small amount of free fluid. The bladder and rectum are normal. There is minimal degenerate change of the spine. IMPRESSION: Colonic diverticulosis with moderate acute diverticulitis involving the hepatic flexure. Second site of less severe acute diverticulitis involving the distal descending colon. Cannot completely exclude subtle focal contained perforation adjacent the inflamed diverticula over the hepatic flexure. No evidence abscess. Moderate cholelithiasis. Umbilical hernia  containing only peritoneal fat. Electronically Signed   By: Elberta Fortis M.D.   On: 04/19/2016 19:10   US Abdomen Limited  04/19/2016  CLINICAL DATA:  Right upper quadrant pain for the past 2 days; history of fatty liver disease EXAM: US ABDOMEN LIMITED - RIGHT UPPER QUADRANT COMPARISON:  Abdominal ultrasound dated December 02, 2010 FINDINGS: Gallbladder: The gallbladder is adequately distended. There are echogenic mobile shadowing stones. There is no gallbladder wall thickening, pericholecystic fluid, or positive sonographic Murphy's sign. Common bile duct: Diameter: 1 mm Liver: The hepatic echotexture is increased diffusely. There is no focal mass nor ductal dilation. The surface contour of the liver is normal. IMPRESSION: 1. Gallstones and sludge without sonographic evidence of acute cholecystitis. 2. Fatty infiltrative change of the liver. Electronically Signed   By: David  [place] M.D.   On: 04/19/2016 17:11    Microbiology: No results found for this or any previous visit (from the past 240 hour(s)).   Labs: Basic Metabolic Panel:  Recent Labs Lab 04/19/16 1432 04/19/16 1437 04/20/16 0538 04/21/16 0539 04/21/16 0900 04/22/16 0541  NA 136  --  131* 129* 129* 132*  K 3.2*  --  3.2* 3.5 3.0* 4.1  CL 99*  --  96* 99* 99* 103  CO2 24  --  25 23 23 24   GLUCOSE 135*  --  116* 116* 142* 88  BUN 12  --  12 7 7 6   CREATININE 0.82  --  0.95 0.78 0.86 0.71  CALCIUM 9.4  --  8.4* 7.5* 7.5* 7.8*  MG  --  1.2*  --  2.2  --   --   PHOS  --  2.3*  --  2.0*  --   --    Liver Function Tests:  Recent Labs Lab 04/19/16 1432 04/20/16 0538 04/21/16 0539 04/22/16 0541  AST 72* 52* 35 31  ALT 54 42 30 26  ALKPHOS 57 47 46 47  BILITOT 2.7* 2.4* 1.7* 1.4*  PROT 8.5* 7.3 6.6 6.5  ALBUMIN 4.6 3.8 3.4* 3.2*    Recent Labs Lab 04/19/16 1432  LIPASE 17   No results for input(s): AMMONIA in the last 168 hours. CBC:  Recent Labs Lab 04/19/16 1432 04/20/16 0538 04/21/16 0539  04/22/16 0541  WBC 12.6* 12.9* 10.9* 7.0  NEUTROABS  --  9.2* 7.3 4.4  HGB 14.1 12.6 10.5* 10.5*  HCT 40.2 36.9 30.9* 31.2*  MCV 89.5 90.7 89.6 91.8  PLT 145* 114* 88* 110*   Cardiac Enzymes: No results for input(s): CKTOTAL, CKMB, CKMBINDEX, TROPONINI in the last 168 hours. BNP: BNP (last 3 results) No results for input(s): BNP in the last  8760 hours.  ProBNP (last 3 results) No results for input(s): PROBNP in the last 8760 hours.  CBG: No results for input(s): GLUCAP in the last 168 hours.     SignedCalvert Cantor:  Kaisen Ackers, MD [facility] 04/22/2016, 1:18 PM

## 2016-04-22 NOTE — Discharge Instructions (Signed)
Diverticulitis Diverticulitis is when small pockets that have formed in your colon (large intestine) become infected or swollen. HOME CARE  Follow your doctor's instructions.  Follow a special diet if told by your doctor.  When you feel better, your doctor may tell you to change your diet. You may be told to eat a lot of fiber. Fruits and vegetables are good sources of fiber. Fiber makes it easier to poop (have bowel movements).  Take supplements or probiotics as told by your doctor.  Only take medicines as told by your doctor.  Keep all follow-up visits with your doctor. GET HELP IF:  Your pain does not get better.  You have a hard time eating food.  You are not pooping like normal. GET HELP RIGHT AWAY IF:  Your pain gets worse.  Your problems do not get better.  Your problems suddenly get worse.  You have a fever.  You keep throwing up (vomiting).  You have bloody or black, tarry poop (stool). MAKE SURE YOU:   Understand these instructions.  Will watch your condition.  Will get help right away if you are not doing well or get worse.   This information is not intended to replace advice given to you by your health care provider. Make sure you discuss any questions you have with your health care provider.   Document Released: 05/08/2008 Document Revised: 11/25/2013 Document Reviewed: 10/15/2013 Elsevier Interactive Patient Education 2016 [employer].  Diverticulitis Diverticulitis is inflammation or infection of small pouches in your colon that form when you have a condition called diverticulosis. The pouches in your colon are called diverticula. Your colon, or large intestine, is where water is absorbed and stool is formed. Complications of diverticulitis can include:  Bleeding.  Severe infection.  Severe pain.  Perforation of your colon.  Obstruction of your colon. CAUSES  Diverticulitis is caused by bacteria. Diverticulitis happens when stool becomes  trapped in diverticula. This allows bacteria to grow in the diverticula, which can lead to inflammation and infection. RISK FACTORS People with diverticulosis are at risk for diverticulitis. Eating a diet that does not include enough fiber from fruits and vegetables may make diverticulitis more likely to develop. SYMPTOMS  Symptoms of diverticulitis may include:  Abdominal pain and tenderness. The pain is normally located on the left side of the abdomen, but may occur in other areas.  Fever and chills.  Bloating.  Cramping.  Nausea.  Vomiting.  Constipation.  Diarrhea.  Blood in your stool. DIAGNOSIS  Your health care provider will ask you about your medical history and do a physical exam. You may need to have tests done because many medical conditions can cause the same symptoms as diverticulitis. Tests may include:  Blood tests.  Urine tests.  Imaging tests of the abdomen, including X-rays and CT scans. When your condition is under control, your health care provider may recommend that you have a colonoscopy. A colonoscopy can show how severe your diverticula are and whether something else is causing your symptoms. TREATMENT  Most cases of diverticulitis are mild and can be treated at home. Treatment may include:  Taking over-the-counter pain medicines.  Following a clear liquid diet.  Taking antibiotic medicines by mouth for 7-10 days. More severe cases may be treated at a hospital. Treatment may include:  Not eating or drinking.  Taking prescription pain medicine.  Receiving antibiotic medicines through an IV tube.  Receiving fluids and nutrition through an IV tube.  Surgery. HOME CARE INSTRUCTIONS  Follow your health care provider's instructions carefully.  Follow a full liquid diet or other diet as directed by your health care provider. After your symptoms improve, your health care provider may tell you to change your diet. He or she may recommend you eat  a high-fiber diet. Fruits and vegetables are good sources of fiber. Fiber makes it easier to pass stool.  Take fiber supplements or probiotics as directed by your health care provider.  Only take medicines as directed by your health care provider.  Keep all your follow-up appointments. SEEK MEDICAL CARE IF:   Your pain does not improve.  You have a hard time eating food.  Your bowel movements do not return to normal. SEEK IMMEDIATE MEDICAL CARE IF:   Your pain becomes worse.  Your symptoms do not get better.  Your symptoms suddenly get worse.  You have a fever.  You have repeated vomiting.  You have bloody or black, tarry stools. MAKE SURE YOU:   Understand these instructions.  Will watch your condition.  Will get help right away if you are not doing well or get worse.   This information is not intended to replace advice given to you by your health care provider. Make sure you discuss any questions you have with your health care provider.   Document Released: 08/30/2005 Document Revised: 11/25/2013 Document Reviewed: 10/15/2013 Elsevier Interactive Patient Education 2016 [employer]  Cholelithiasis Cholelithiasis (also called gallstones) is a form of gallbladder disease in which gallstones form in your gallbladder. The gallbladder is an organ that stores bile made in the liver, which helps digest fats. Gallstones begin as small crystals and slowly grow into stones. Gallstone pain occurs when the gallbladder spasms and a gallstone is blocking the duct. Pain can also occur when a stone passes out of the duct.  RISK FACTORS  Being female.   Having multiple pregnancies. Health care providers sometimes advise removing diseased gallbladders before future pregnancies.   Being obese.  Eating a diet heavy in fried foods and fat.   Being older than 60 years and increasing age.   Prolonged use of medicines containing female hormones.   Having diabetes mellitus.    Rapidly losing weight.   Having a family history of gallstones (heredity).  SYMPTOMS  Nausea.   Vomiting.  Abdominal pain.   Yellowing of the skin (jaundice).   Sudden pain. It may persist from several minutes to several hours.  Fever.   Tenderness to the touch. In some cases, when gallstones do not move into the bile duct, people have no pain or symptoms. These are called "silent" gallstones.  TREATMENT Silent gallstones do not need treatment. In severe cases, emergency surgery may be required. Options for treatment include:  Surgery to remove the gallbladder. This is the most common treatment.  Medicines. These do not always work and may take 6-12 months or more to work.  Shock wave treatment (extracorporeal biliary lithotripsy). In this treatment an ultrasound machine sends shock waves to the gallbladder to break gallstones into smaller pieces that can pass into the intestines or be dissolved by medicine. HOME CARE INSTRUCTIONS   Only take over-the-counter or prescription medicines for pain, discomfort, or fever as directed by your health care provider.   Follow a low-fat diet until seen again by your health care provider. Fat causes the gallbladder to contract, which can result in pain.   Follow up with your health care provider as directed. Attacks are almost always recurrent and surgery is usually  required for permanent treatment.  SEEK IMMEDIATE MEDICAL CARE IF:   Your pain increases and is not controlled by medicines.   You have a fever or persistent symptoms for more than 2-3 days.   You have a fever and your symptoms suddenly get worse.   You have persistent nausea and vomiting.  MAKE SURE YOU:   Understand these instructions.  Will watch your condition.  Will get help right away if you are not doing well or get worse.   This information is not intended to replace advice given to you by your health care provider. Make sure you discuss  any questions you have with your health care provider.   Document Released: 11/16/2005 Document Revised: 07/23/2013 Document Reviewed: 05/14/2013 Elsevier Interactive Patient Education [employer]

## 2016-04-22 NOTE — Progress Notes (Signed)
Pt left at this time with her "friend". Alert, oriented, and without c/o.  Discharge instructions/prescription given/explained, as well as pharmacy pickup. Followup appointment noted.

## 2016-05-08 ENCOUNTER — Emergency Department (HOSPITAL_COMMUNITY): Payer: Medicare Other

## 2016-05-08 ENCOUNTER — Encounter (HOSPITAL_COMMUNITY): Payer: Self-pay | Admitting: Emergency Medicine

## 2016-05-08 ENCOUNTER — Inpatient Hospital Stay (HOSPITAL_COMMUNITY)
Admission: EM | Admit: 2016-05-08 | Discharge: 2016-05-12 | DRG: 392 | Disposition: A | Discharge status: Home / Self Care | Payer: Medicare Other | Attending: Surgery | Admitting: Surgery

## 2016-05-08 DIAGNOSIS — K429 Umbilical hernia without obstruction or gangrene: Secondary | ICD-10-CM | POA: Diagnosis present

## 2016-05-08 DIAGNOSIS — K5732 Diverticulitis of large intestine without perforation or abscess without bleeding: Secondary | ICD-10-CM | POA: Diagnosis not present

## 2016-05-08 DIAGNOSIS — R1011 Right upper quadrant pain: Secondary | ICD-10-CM | POA: Diagnosis not present

## 2016-05-08 DIAGNOSIS — R0789 Other chest pain: Secondary | ICD-10-CM | POA: Diagnosis present

## 2016-05-08 DIAGNOSIS — K828 Other specified diseases of gallbladder: Secondary | ICD-10-CM | POA: Diagnosis present

## 2016-05-08 DIAGNOSIS — I10 Essential (primary) hypertension: Secondary | ICD-10-CM | POA: Diagnosis present

## 2016-05-08 DIAGNOSIS — D72829 Elevated white blood cell count, unspecified: Secondary | ICD-10-CM | POA: Diagnosis present

## 2016-05-08 DIAGNOSIS — K760 Fatty (change of) liver, not elsewhere classified: Secondary | ICD-10-CM | POA: Diagnosis present

## 2016-05-08 DIAGNOSIS — F17210 Nicotine dependence, cigarettes, uncomplicated: Secondary | ICD-10-CM | POA: Diagnosis present

## 2016-05-08 DIAGNOSIS — R109 Unspecified abdominal pain: Secondary | ICD-10-CM

## 2016-05-08 HISTORY — DX: Diverticulitis of intestine, part unspecified, without perforation or abscess without bleeding: K57.92

## 2016-05-08 LAB — URINALYSIS, ROUTINE W REFLEX MICROSCOPIC
BILIRUBIN URINE: NEGATIVE
Glucose, UA: NEGATIVE mg/dL
HGB URINE DIPSTICK: NEGATIVE
Ketones, ur: NEGATIVE mg/dL
Leukocytes, UA: NEGATIVE
NITRITE: NEGATIVE
PH: 6.5 (ref 5.0–8.0)
Protein, ur: NEGATIVE mg/dL

## 2016-05-08 LAB — COMPREHENSIVE METABOLIC PANEL
ALT: 21 U/L (ref 14–54)
AST: 40 U/L (ref 15–41)
Albumin: 4.3 g/dL (ref 3.5–5.0)
Alkaline Phosphatase: 76 U/L (ref 38–126)
Anion gap: 8 (ref 5–15)
BILIRUBIN TOTAL: 0.6 mg/dL (ref 0.3–1.2)
BUN: 12 mg/dL (ref 6–20)
CHLORIDE: 101 mmol/L (ref 101–111)
CO2: 27 mmol/L (ref 22–32)
Calcium: 9.3 mg/dL (ref 8.9–10.3)
Creatinine, Ser: 0.83 mg/dL (ref 0.44–1.00)
GFR calc Af Amer: >60 mL/min (ref >60)
GFR calc non Af Amer: >60 mL/min (ref >60)
GLUCOSE: 132 mg/dL — AB (ref 65–99)
POTASSIUM: 3.7 mmol/L (ref 3.5–5.1)
Sodium: 136 mmol/L (ref 135–145)
Total Protein: 8.7 g/dL — ABNORMAL HIGH (ref 6.5–8.1)

## 2016-05-08 LAB — CBC
HEMATOCRIT: 36.1 % (ref 36.0–46.0)
HEMOGLOBIN: 12.2 g/dL (ref 12.0–15.0)
MCH: 30.5 pg (ref 26.0–34.0)
MCHC: 33.8 g/dL (ref 30.0–36.0)
MCV: 90.3 fL (ref 78.0–100.0)
Platelets: 281 10*3/uL (ref 150–400)
RBC: 4 MIL/uL (ref 3.87–5.11)
RDW: 16.5 % — ABNORMAL HIGH (ref 11.5–15.5)
WBC: 15.4 10*3/uL — ABNORMAL HIGH (ref 4.0–10.5)

## 2016-05-08 LAB — LIPASE, BLOOD: LIPASE: 37 U/L (ref 11–51)

## 2016-05-08 MED ORDER — HYDROMORPHONE HCL 1 MG/ML IJ SOLN: 1.0000 mg | Freq: Once | INTRAMUSCULAR | Status: DC

## 2016-05-08 MED ORDER — ALBUTEROL SULFATE (2.5 MG/3ML) 0.083% IN NEBU: 3.0000 mL | INHALATION_SOLUTION | Freq: Four times a day (QID) | RESPIRATORY_TRACT | Status: DC | PRN

## 2016-05-08 MED ORDER — HYDROCODONE-ACETAMINOPHEN 5-325 MG PO TABS
1.0000 | ORAL_TABLET | ORAL | Status: DC | PRN
  Administered 2016-05-11 – 2016-05-12 (×3): 2 via ORAL
  Filled 2016-05-08 (×3): qty 2

## 2016-05-08 MED ORDER — ALBUTEROL SULFATE HFA 108 (90 BASE) MCG/ACT IN AERS: 1.0000 | INHALATION_SPRAY | Freq: Four times a day (QID) | RESPIRATORY_TRACT | Status: DC | PRN

## 2016-05-08 MED ORDER — HYDROMORPHONE HCL 1 MG/ML IJ SOLN
0.5000 mg | Freq: Once | INTRAMUSCULAR | Status: AC
  Administered 2016-05-08: 0.5 mg via INTRAVENOUS
  Filled 2016-05-08: qty 1

## 2016-05-08 MED ORDER — ACETAMINOPHEN 650 MG RE SUPP: 650.0000 mg | Freq: Four times a day (QID) | RECTAL | Status: DC | PRN

## 2016-05-08 MED ORDER — PIPERACILLIN-TAZOBACTAM 3.375 G IVPB
3.3750 g | Freq: Three times a day (TID) | INTRAVENOUS | Status: DC
Start: 2016-05-08 — End: 2016-05-11
  Administered 2016-05-08 – 2016-05-11 (×8): 3.375 g via INTRAVENOUS
  Filled 2016-05-08 (×9): qty 50

## 2016-05-08 MED ORDER — PIPERACILLIN-TAZOBACTAM 3.375 G IVPB
3.3750 g | Freq: Three times a day (TID) | INTRAVENOUS | Status: DC
  Administered 2016-05-08: 3.375 g via INTRAVENOUS
  Filled 2016-05-08: qty 50

## 2016-05-08 MED ORDER — ONDANSETRON HCL 4 MG/2ML IJ SOLN: 4.0000 mg | Freq: Four times a day (QID) | INTRAMUSCULAR | Status: DC | PRN

## 2016-05-08 MED ORDER — HYDROMORPHONE HCL 1 MG/ML IJ SOLN
0.5000 mg | INTRAMUSCULAR | Status: DC | PRN
  Administered 2016-05-08 – 2016-05-10 (×10): 1 mg via INTRAVENOUS
  Filled 2016-05-08 (×10): qty 1

## 2016-05-08 MED ORDER — ONDANSETRON 4 MG PO TBDP: 4.0000 mg | ORAL_TABLET | Freq: Once | ORAL | Status: DC | PRN

## 2016-05-08 MED ORDER — DIPHENHYDRAMINE HCL 25 MG PO CAPS: 25.0000 mg | ORAL_CAPSULE | Freq: Four times a day (QID) | ORAL | Status: DC | PRN

## 2016-05-08 MED ORDER — SODIUM CHLORIDE 0.9 % IV BOLUS (SEPSIS)
1000.0000 mL | Freq: Once | INTRAVENOUS | Status: AC
  Administered 2016-05-08: 1000 mL via INTRAVENOUS

## 2016-05-08 MED ORDER — DIPHENHYDRAMINE HCL 50 MG/ML IJ SOLN: 25.0000 mg | Freq: Four times a day (QID) | INTRAMUSCULAR | Status: DC | PRN

## 2016-05-08 MED ORDER — METHOCARBAMOL 500 MG PO TABS: 500.0000 mg | ORAL_TABLET | Freq: Four times a day (QID) | ORAL | Status: DC | PRN

## 2016-05-08 MED ORDER — HYDRALAZINE HCL 20 MG/ML IJ SOLN: 10.0000 mg | INTRAMUSCULAR | Status: DC | PRN

## 2016-05-08 MED ORDER — IOPAMIDOL (ISOVUE-300) INJECTION 61%
100.0000 mL | Freq: Once | INTRAVENOUS | Status: AC | PRN
  Administered 2016-05-08: 100 mL via INTRAVENOUS

## 2016-05-08 MED ORDER — ENOXAPARIN SODIUM 40 MG/0.4ML ~~LOC~~ SOLN
40.0000 mg | SUBCUTANEOUS | Status: DC
  Administered 2016-05-08 – 2016-05-11 (×4): 40 mg via SUBCUTANEOUS
  Filled 2016-05-08 (×5): qty 0.4

## 2016-05-08 MED ORDER — ONDANSETRON HCL 4 MG/2ML IJ SOLN
4.0000 mg | Freq: Once | INTRAMUSCULAR | Status: AC
  Administered 2016-05-08: 4 mg via INTRAVENOUS
  Filled 2016-05-08: qty 2

## 2016-05-08 MED ORDER — FAMOTIDINE 20 MG PO TABS
20.0000 mg | ORAL_TABLET | Freq: Two times a day (BID) | ORAL | Status: DC
  Administered 2016-05-08 – 2016-05-12 (×9): 20 mg via ORAL
  Filled 2016-05-08 (×12): qty 1

## 2016-05-08 MED ORDER — SIMETHICONE 80 MG PO CHEW
40.0000 mg | CHEWABLE_TABLET | Freq: Four times a day (QID) | ORAL | Status: DC | PRN
  Filled 2016-05-08: qty 1

## 2016-05-08 MED ORDER — POTASSIUM CHLORIDE IN NACL 20-0.9 MEQ/L-% IV SOLN
INTRAVENOUS | Status: DC
  Administered 2016-05-08 – 2016-05-10 (×3): via INTRAVENOUS
  Filled 2016-05-08 (×5): qty 1000

## 2016-05-08 MED ORDER — DEXTROSE 5 % IV SOLN
2.0000 g | Freq: Once | INTRAVENOUS | Status: AC
  Administered 2016-05-08: 2 g via INTRAVENOUS
  Filled 2016-05-08: qty 2

## 2016-05-08 MED ORDER — DIATRIZOATE MEGLUMINE & SODIUM 66-10 % PO SOLN
30.0000 mL | Freq: Once | ORAL | Status: AC
  Administered 2016-05-08: 30 mL via ORAL
  Filled 2016-05-08: qty 30

## 2016-05-08 MED ORDER — ACETAMINOPHEN 325 MG PO TABS: 650.0000 mg | ORAL_TABLET | Freq: Four times a day (QID) | ORAL | Status: DC | PRN

## 2016-05-08 NOTE — ED Notes (Signed)
Report given to Emily, RN.

## 2016-05-08 NOTE — ED Notes (Signed)
Attempted to give report twice and was told nurse is in another room.  She would return my call.

## 2016-05-08 NOTE — ED Provider Notes (Signed)
CSN: [id number]     Arrival date & time 05/08/16  4098 History   First MD Initiated Contact with Patient 05/08/16 [phone]     Chief Complaint  Patient presents with  . Abdominal Pain     (Consider location/radiation/quality/duration/timing/severity/associated sxs/prior Treatment) HPI   Patient has a PMH of diverticulitis, c-section, fatty liver, hypertension and bulging disc. She comes to the ER for evaluation and management of abdominal pain. She felt fine yesterday but was acutely awoken from her sleep with severe pain. She is unable to localize this pain. She had a large episode of vomiting in the waiting room here in the ED but otherwise denies feeling nauseous or having any other episodes of vomiting. She denies having diarrhea, constipation, vagina symptoms, urinary symptoms, back pain, fevers, weakness, headache.   She says this symptoms feel the same  but only worse than her diverticulitis. She says that with this episode she has pain with twisting her torso.  Past Medical History  Diagnosis Date  . Bulging disc   . Fatty liver   . Hypertension   . Diverticulitis    Past Surgical History  Procedure Laterality Date  . Cesarean section    . Abdominal hysterectomy    . Ankle surgery    . Appendectomy     Family History  Problem Relation Age of Onset  . Deep vein thrombosis Mother   . Cancer - Other Sister    Social History  Substance Use Topics  . Smoking status: Current Every Day Smoker -- 0.50 packs/day for 20 years  . Smokeless tobacco: Never Used  . Alcohol Use: Yes     Comment: 4 or 5 drinks per day with soda and ice -04/19/16   OB History    No data available     Review of Systems  ,Review of Systems All other systems negative except as documented in the HPI. All pertinent positives and negatives as reviewed in the HPI.   Allergies  Aspirin and Lisinopril  Home Medications   Prior to Admission medications   Medication Sig Start Date End Date Taking?  Authorizing Provider  albuterol (PROVENTIL HFA;VENTOLIN HFA) 108 (90 BASE) MCG/ACT inhaler Inhale 1-2 puffs into the lungs every 6 (six) hours as needed for wheezing or shortness of breath.   Yes Historical Provider, MD  famotidine (PEPCID) 20 MG tablet Take 20 mg by mouth 2 (two) times daily. 04/22/16  Yes Historical Provider, MD  naproxen sodium (ANAPROX) 220 MG tablet Take 440 mg by mouth 2 (two) times daily as needed (for pain.).    Yes Historical Provider, MD  ciprofloxacin (CIPRO) 500 MG tablet Take 1 tablet (500 mg total) by mouth 2 (two) times daily. 04/22/16   Calvert Cantor, MD  diphenhydrAMINE (BENADRYL) 50 MG tablet Take 0.5 tablets (25 mg total) by mouth every 8 (eight) hours. 04/22/16   Calvert Cantor, MD  loperamide (IMODIUM A-D) 2 MG tablet Take 1 tablet (2 mg total) by mouth 4 (four) times daily as needed for diarrhea or loose stools. 04/22/16   Calvert Cantor, MD  metroNIDAZOLE (FLAGYL) 500 MG tablet Take 1 tablet (500 mg total) by mouth 3 (three) times daily. 04/22/16   Calvert Cantor, MD   BP 144/88 mmHg  Pulse 69  Temp(Src) 98.6 F (37 C) (Oral)  Resp 21  Ht 5' 6.5" (1.689 m)  Wt 84.369 kg  BMI 29.57 kg/m2  SpO2 95% Physical Exam  Constitutional: She appears well-developed and well-nourished. She appears distressed (crying due to  pain).  HENT:  Head: Normocephalic and atraumatic.  Right Ear: Tympanic membrane and ear canal normal.  Left Ear: Tympanic membrane and ear canal normal.  Nose: Nose normal.  Mouth/Throat: Uvula is midline and mucous membranes are normal.  Eyes: Pupils are equal, round, and reactive to light.  Neck: Normal range of motion. Neck supple.  Cardiovascular: Normal rate and regular rhythm.   Pulmonary/Chest: Effort normal.  Abdominal: Soft. Bowel sounds are normal. She exhibits no shifting dullness and no distension. There is tenderness (diffuse). There is guarding (periumbilical). There is no rebound, no CVA tenderness and negative Murphy's sign.  No signs  of abdominal distention  Musculoskeletal:  No LE swelling  Neurological: She is alert.  Acting at baseline  Skin: Skin is warm and dry. No rash noted.  Nursing note and vitals reviewed.   ED Course  Procedures (including critical care time) Labs Review Labs Reviewed  COMPREHENSIVE METABOLIC PANEL - Abnormal; Notable for the following:    Glucose, Bld 132 (*)    Total Protein 8.7 (*)    All other components within normal limits  CBC - Abnormal; Notable for the following:    WBC 15.4 (*)    RDW 16.5 (*)    All other components within normal limits  LIPASE, BLOOD  URINALYSIS, ROUTINE W REFLEX MICROSCOPIC (NOT AT [facility])    Imaging Review No results found. I have personally reviewed and evaluated these images and lab results as part of my medical decision-making.   EKG Interpretation None      MDM   Final diagnoses:  None    Medications  ondansetron (ZOFRAN-ODT) disintegrating tablet 4 mg (not administered)  ondansetron (ZOFRAN) injection 4 mg (not administered)  HYDROmorphone (DILAUDID) injection 0.5 mg (not administered)  diatrizoate meglumine-sodium (GASTROGRAFIN) 66-10 % solution 30 mL (not administered)  sodium chloride 0.9 % bolus 1,000 mL (not administered)   Patient in severe pain, symptoms are similar to that of when she had diverticulitis. She says that she has been subscribing to the dietary recommendations given to her for diverticulitis and does not know why the pain has reoccured. She has been given pain medication for pain control and CT abd/pelv ordered.  At end of shift patient hand off to RobinetteGekas, PA-C: recheck for pain control and waiting to get CT of the stomach.    Marlon Peliffany Aseel Truxillo, PA-C 05/08/16 [id number]  Shon Batonourtney F Horton, MD 05/09/16 [phone]

## 2016-05-08 NOTE — ED Notes (Signed)
Requested patient to urinate. 

## 2016-05-08 NOTE — ED Notes (Addendum)
Pt states that she woke up with upper right abdominal pain. Pt experienced emesis while she was in the lobby. Pt state that was the first instance of emesis she experienced Pt was seen on 5/17 for similar symptoms and was diagnosed with diverticulitis and gallstones. Pt was admitted for about 3 days

## 2016-05-08 NOTE — ED Notes (Signed)
Attempted to give report to charge nurse but was told he was in another room.

## 2016-05-08 NOTE — ED Provider Notes (Signed)
[id number] AM: 60 year old female with PMH of diverticulitis and NASH presents with acute onset of abdominal pain for the past several hours. Patient was signed out to me by Dinah Beers Greene PA-C. Patient is currently awaiting abdominal CT with contrast. Labs are remarkable for white count of 15.4 but are otherwise unremarkable. UA pending. She has received pain medicine which she states has helped her abdominal pain. On repeat abdominal exam she is mostly tender over the right upper quadrant and epigastric area. Also of note she has hepatomegaly.  7:45: Abdominal CT shows questionable cholecystitis. Will obtain RUQ US due to her tenderness in RUQ on exam.  9:15: RUQ US shows acute cholecystitis with questionable cirrhosis. Will obtain surgical consult and start Ceftriaxone.    Bethel BornKelly Marie Carlin Mamone, PA-C 05/08/16 [id number]  Lavera Guiseana Duo Liu, MD 05/08/16 [phone]

## 2016-05-08 NOTE — H&P (Signed)
[facility] Admission Note  Candice Roy [birth year]  [id number].    Requesting MD: Dr. Dina Rich Chief Complaint/Reason for Consult: RUQ abdominal pain  HPI:  60 y/o AA female with PMH of diverticulitis, c-section, fatty liver/NASH, HTN, and bulging disc. She comes to [facility] for evaluation and management of abdominal pain.  Of note she was recently hospitalized between 04/19/16 to 04/22/16 for right sided diverticulitis.  She felt fine yesterday, but was acutely awoken from her sleep at 1am with severe RUQ abdominal pain. She had a large episode of vomiting in the waiting room here in the ED but otherwise denies feeling nauseous or having any other episodes of vomiting.  She had a strained BM this morning at 1am.  She denies having diarrhea, fever/chills, dysuria.  She says the pain feels the same as the last time she was here.  She denies any hematochezia or melana, but did have this on last admission.  She has a asymptomatic umbilical hernia.  Past surgical h/o includes appendectomy, c-section, and hysterectomy.  Used to smoke 2ppd, but has cut back to 1 pack every couple of days.  She would like to quit.    CT shows improving right sided diverticulitis, leukocytosis, and cholelithiasis with pericholecystic fluid concerning for secondary inflammatory process or acute cholecystitis.  LFT's are normal.    ROS: All systems reviewed and otherwise negative except for as above  Family History  Problem Relation Age of Onset  . Deep vein thrombosis Mother   . Cancer - Other Sister     Past Medical History  Diagnosis Date  . Bulging disc   . Fatty liver   . Hypertension   . Diverticulitis     Past Surgical History  Procedure Laterality Date  . Cesarean section    . Abdominal hysterectomy    . Ankle surgery    . Appendectomy      Social History:  reports that she has been smoking.  She has never used smokeless tobacco. She reports that she drinks alcohol. She reports that she  does not use illicit drugs.  Allergies:  Allergies  Allergen Reactions  . Aspirin Hives    Childhood allergy- can take alleve/ Naproxen  . Lisinopril Swelling     (Not in a hospital admission)  Blood pressure 130/85, pulse 67, temperature 98.3 F (36.8 C), temperature source Oral, resp. rate 18, height 5' 6.5" (1.689 m), weight 186 lb (84.369 kg), SpO2 97 %. Physical Exam: General: pleasant, WD/WN AA female who is laying in bed in NAD HEENT: head is normocephalic, atraumatic.  Sclera are noninjected.  PERRL.  Ears and nose without any masses or lesions.  Mouth is pink and moist Heart: regular, rate, and rhythm.  No obvious murmurs, gallops, or rubs noted.  Palpable pedal pulses bilaterally Lungs: CTAB, no wheezes, rhonchi, or rales noted.  Respiratory effort nonlabored Abd: soft, mild distension, tenderness in the RUQ, +BS, no masses, hernias, or organomegaly, surgical scars noted in RLQ, umbilicus. MS: all 4 extremities are symmetrical with no cyanosis, clubbing, or edema. Skin: warm and dry with no masses, lesions, or rashes Psych: A&Ox3 with an appropriate affect.   Results for orders placed or performed during the hospital encounter of 05/08/16 (from the past 48 hour(s))  Lipase, blood     Status: None   Collection Time: 05/08/16  4:59 AM  Result Value Ref Range   Lipase 37 11 - 51 U/L  Comprehensive metabolic panel     Status: Abnormal  Collection Time: 05/08/16  4:59 AM  Result Value Ref Range   Sodium 136 135 - 145 mmol/L   Potassium 3.7 3.5 - 5.1 mmol/L   Chloride 101 101 - 111 mmol/L   CO2 27 22 - 32 mmol/L   Glucose, Bld 132 (H) 65 - 99 mg/dL   BUN 12 6 - 20 mg/dL   Creatinine, Ser 0.83 0.44 - 1.00 mg/dL   Calcium 9.3 8.9 - 10.3 mg/dL   Total Protein 8.7 (H) 6.5 - 8.1 g/dL   Albumin 4.3 3.5 - 5.0 g/dL   AST 40 15 - 41 U/L   ALT 21 14 - 54 U/L   Alkaline Phosphatase 76 38 - 126 U/L   Total Bilirubin 0.6 0.3 - 1.2 mg/dL   GFR calc non Af Amer >60 >60 mL/min    GFR calc Af Amer >60 >60 mL/min    Comment: (NOTE) The eGFR has been calculated using the CKD EPI equation. This calculation has not been validated in all clinical situations. eGFR's persistently <60 mL/min signify possible Chronic Kidney Disease.    Anion gap 8 5 - 15  CBC     Status: Abnormal   Collection Time: 05/08/16  4:59 AM  Result Value Ref Range   WBC 15.4 (H) 4.0 - 10.5 K/uL   RBC 4.00 3.87 - 5.11 MIL/uL   Hemoglobin 12.2 12.0 - 15.0 g/dL   HCT 36.1 36.0 - 46.0 %   MCV 90.3 78.0 - 100.0 fL   MCH 30.5 26.0 - 34.0 pg   MCHC 33.8 30.0 - 36.0 g/dL   RDW 16.5 (H) 11.5 - 15.5 %   Platelets 281 150 - 400 K/uL  Urinalysis, Routine w reflex microscopic     Status: Abnormal   Collection Time: 05/08/16  8:36 AM  Result Value Ref Range   Color, Urine YELLOW YELLOW   APPearance CLEAR CLEAR   Specific Gravity, Urine >1.046 (H) 1.005 - 1.030   pH 6.5 5.0 - 8.0   Glucose, UA NEGATIVE NEGATIVE mg/dL   Hgb urine dipstick NEGATIVE NEGATIVE   Bilirubin Urine NEGATIVE NEGATIVE   Ketones, ur NEGATIVE NEGATIVE mg/dL   Protein, ur NEGATIVE NEGATIVE mg/dL   Nitrite NEGATIVE NEGATIVE   Leukocytes, UA NEGATIVE NEGATIVE    Comment: MICROSCOPIC NOT DONE ON URINES WITH NEGATIVE PROTEIN, BLOOD, LEUKOCYTES, NITRITE, OR GLUCOSE <1000 mg/dL.   Ct Abdomen Pelvis W Contrast  05/08/2016  CLINICAL DATA:  Diffuse abdominal pain, left lower quadrant pain. Recent diagnosis of diverticulitis. History of kidney stones. EXAM: CT ABDOMEN AND PELVIS WITH CONTRAST TECHNIQUE: Multidetector CT imaging of the abdomen and pelvis was performed using the standard protocol following bolus administration of intravenous contrast. CONTRAST:  121m ISOVUE-300 IOPAMIDOL (ISOVUE-300) INJECTION 61% COMPARISON:  04/19/2016 FINDINGS: Lower chest: Heart is upper limits normal in size. Linear scar peripherally in the right lower lobe. Lung bases otherwise clear. Hepatobiliary: No focal hepatic abnormality. Layering gallstones within  the gallbladder. Pericholecystic fluid noted about the gallbladder. No visible gallbladder wall thickening. Pancreas: No focal abnormality or ductal dilatation. Spleen: No focal abnormality.  Normal size. Adrenals/Urinary Tract: No adrenal abnormality. No focal renal abnormality. No stones or hydronephrosis. Urinary bladder is unremarkable. Stomach/Bowel: Diffuse colonic diverticulosis. Improving but continued mild inflammatory change around the ascending colon compatible with improving diverticulitis. Stomach and small bowel decompressed, unremarkable. Vascular/Lymphatic: Aortic and iliac calcifications.  No aneurysm. Reproductive: Prior hysterectomy.  No adnexal masses. Other: No free fluid or free air.  Umbilical hernia containing fat. Musculoskeletal: No  acute bony abnormality or focal bone lesion. IMPRESSION: Diffuse colonic diverticulosis. Improving but continued mild inflammatory change around the ascending colon compatible with diverticulitis. No focal fluid collection or free air. Cholelithiasis. Mild pericholecystic fluid around the gallbladder. While this could be related to the inflammatory process/ diverticulitis in the right upper quadrant, I cannot exclude cholecystitis. Consider ultrasound if there is clinical concern for cholecystitis. Electronically Signed   By: Rolm Baptise M.D.   On: 05/08/2016 07:36   US Abdomen Limited Ruq  05/08/2016  CLINICAL DATA:  Right upper quadrant pain beginning last night. Initial encounter. EXAM: US ABDOMEN LIMITED - RIGHT UPPER QUADRANT COMPARISON:  CT abdomen and pelvis this same day and 04/19/2016. Right upper quadrant ultrasound 04/19/2016. FINDINGS: Gallbladder: There is pericholecystic fluid. Gallbladder wall thickness is 0.4 cm. Several small stones are identified measuring up to 0.6 cm. The gallbladder is distended. Common bile duct: Diameter: 0.2 cm. Liver: No focal lesion is identified. Nodularity of the left lobe of the liver is identified. IMPRESSION:  Findings compatible with acute cholecystitis. Nodular appearance of the border of the left hepatic lobe worrisome for cirrhosis. Electronically Signed   By: Inge Rise M.D.   On: 05/08/2016 09:02      Assessment/Plan Right sided diverticulitis - improved from previous studies Enlarged gallbladder with chronic calcified stones Leukocytosis - 15.4 -Admit to [facility], medical management for now, hard to determine if diverticulitis is secondarily causing inflammatory changes of the gallbladder. LFT's are normal.  Could just be continued diverticulitis vs addition of cholecystitis.  Consider HIDA if not improving.   -NPO, bowel rest, IVF, pain control, antiemetics, antibiotics (Zosyn Day #1) -SCD's and lovenox for DVT proph -Ambulate and IS -Home meds  Tobacco abuse - h/o 2ppd, wanting to quit   Nat Christen, [facility] Surgery 05/08/2016, 10:36 AM Pager: [phone] (7am - 4:30pm M-F; 7am - 11:30am Sa/Su)

## 2016-05-09 DIAGNOSIS — I10 Essential (primary) hypertension: Secondary | ICD-10-CM | POA: Diagnosis present

## 2016-05-09 DIAGNOSIS — R0789 Other chest pain: Secondary | ICD-10-CM | POA: Diagnosis present

## 2016-05-09 DIAGNOSIS — K429 Umbilical hernia without obstruction or gangrene: Secondary | ICD-10-CM | POA: Diagnosis present

## 2016-05-09 DIAGNOSIS — K5732 Diverticulitis of large intestine without perforation or abscess without bleeding: Secondary | ICD-10-CM | POA: Diagnosis present

## 2016-05-09 DIAGNOSIS — F17210 Nicotine dependence, cigarettes, uncomplicated: Secondary | ICD-10-CM | POA: Diagnosis present

## 2016-05-09 DIAGNOSIS — R1011 Right upper quadrant pain: Secondary | ICD-10-CM | POA: Diagnosis present

## 2016-05-09 DIAGNOSIS — K828 Other specified diseases of gallbladder: Secondary | ICD-10-CM | POA: Diagnosis present

## 2016-05-09 DIAGNOSIS — K760 Fatty (change of) liver, not elsewhere classified: Secondary | ICD-10-CM | POA: Diagnosis present

## 2016-05-09 LAB — COMPREHENSIVE METABOLIC PANEL
ALBUMIN: 3.3 g/dL — AB (ref 3.5–5.0)
ALT: 16 U/L (ref 14–54)
ANION GAP: 7 (ref 5–15)
AST: 27 U/L (ref 15–41)
Alkaline Phosphatase: 49 U/L (ref 38–126)
BUN: 7 mg/dL (ref 6–20)
CHLORIDE: 105 mmol/L (ref 101–111)
CO2: 25 mmol/L (ref 22–32)
Calcium: 8.5 mg/dL — ABNORMAL LOW (ref 8.9–10.3)
Creatinine, Ser: 0.84 mg/dL (ref 0.44–1.00)
GFR calc Af Amer: >60 mL/min (ref >60)
GFR calc non Af Amer: >60 mL/min (ref >60)
GLUCOSE: 86 mg/dL (ref 65–99)
POTASSIUM: 4 mmol/L (ref 3.5–5.1)
SODIUM: 137 mmol/L (ref 135–145)
Total Bilirubin: 0.7 mg/dL (ref 0.3–1.2)
Total Protein: 6.9 g/dL (ref 6.5–8.1)

## 2016-05-09 LAB — CBC
HEMATOCRIT: 32.3 % — AB (ref 36.0–46.0)
HEMOGLOBIN: 10.6 g/dL — AB (ref 12.0–15.0)
MCH: 29.9 pg (ref 26.0–34.0)
MCHC: 32.8 g/dL (ref 30.0–36.0)
MCV: 91.2 fL (ref 78.0–100.0)
Platelets: 241 10*3/uL (ref 150–400)
RBC: 3.54 MIL/uL — ABNORMAL LOW (ref 3.87–5.11)
RDW: 16.7 % — ABNORMAL HIGH (ref 11.5–15.5)
WBC: 6.6 10*3/uL (ref 4.0–10.5)

## 2016-05-09 NOTE — Progress Notes (Signed)
[facility] Progress Note     Subjective: Pt's pain much improved.  Using IV pain meds.  No N/V, thirsty.  Having flatus and urinating well.  No BM yet.  Ambulating in room, but not in hall yet.  Objective: Vital signs in last 24 hours: Temp:  [97.7 F (36.5 C)-98.7 F (37.1 C)] 98.7 F (37.1 C) (06/06 0554) Pulse Rate:  [64-77] 71 (06/06 0554) Resp:  [15-18] 16 (06/06 0554) BP: (128-136)/(77-90) 135/82 mmHg (06/06 0554) SpO2:  [92 %-97 %] 92 % (06/06 0554) Last BM Date: 05/08/16  Intake/Output from previous day: 06/05 0701 - 06/06 0700 In: 1785 [I.V.:1685; IV Piggyback:100] Out: 800 [Urine:800] Intake/Output this shift:    PE: Gen:  Alert, NAD, pleasant Abd: Soft, mild distension, minimal tenderness in RUQ, +BS, no HSM, scars noted   Lab Results:   Recent Labs  05/08/16 0459 05/09/16 0532  WBC 15.4* 6.6  HGB 12.2 10.6*  HCT 36.1 32.3*  PLT 281 241   BMET  Recent Labs  05/08/16 0459 05/09/16 0532  NA 136 137  K 3.7 4.0  CL 101 105  CO2 27 25  GLUCOSE 132* 86  BUN 12 7  CREATININE 0.83 0.84  CALCIUM 9.3 8.5*   PT/INR No results for input(s): LABPROT, INR in the last 72 hours. CMP     Component Value Date/Time   NA 137 05/09/2016 0532   K 4.0 05/09/2016 0532   CL 105 05/09/2016 0532   CO2 25 05/09/2016 0532   GLUCOSE 86 05/09/2016 0532   BUN 7 05/09/2016 0532   CREATININE 0.84 05/09/2016 0532   CALCIUM 8.5* 05/09/2016 0532   PROT 6.9 05/09/2016 0532   ALBUMIN 3.3* 05/09/2016 0532   AST 27 05/09/2016 0532   ALT 16 05/09/2016 0532   ALKPHOS 49 05/09/2016 0532   BILITOT 0.7 05/09/2016 0532   GFRNONAA >60 05/09/2016 0532   GFRAA >60 05/09/2016 0532   Lipase     Component Value Date/Time   LIPASE 37 05/08/2016 0459       Studies/Results: Ct Abdomen Pelvis W Contrast  05/08/2016  CLINICAL DATA:  Diffuse abdominal pain, left lower quadrant pain. Recent diagnosis of diverticulitis. History of kidney stones. EXAM: CT ABDOMEN  AND PELVIS WITH CONTRAST TECHNIQUE: Multidetector CT imaging of the abdomen and pelvis was performed using the standard protocol following bolus administration of intravenous contrast. CONTRAST:  ISOVUE-300 IOPAMIDOL (ISOVUE-300) INJECTION 61% COMPARISON:  04/19/2016 FINDINGS: Lower chest: Heart is upper limits normal in size. Linear scar peripherally in the right lower lobe. Lung bases otherwise clear. Hepatobiliary: No focal hepatic abnormality. Layering gallstones within the gallbladder. Pericholecystic fluid noted about the gallbladder. No visible gallbladder wall thickening. Pancreas: No focal abnormality or ductal dilatation. Spleen: No focal abnormality.  Normal size. Adrenals/Urinary Tract: No adrenal abnormality. No focal renal abnormality. No stones or hydronephrosis. Urinary bladder is unremarkable. Stomach/Bowel: Diffuse colonic diverticulosis. Improving but continued mild inflammatory change around the ascending colon compatible with improving diverticulitis. Stomach and small bowel decompressed, unremarkable. Vascular/Lymphatic: Aortic and iliac calcifications.  No aneurysm. Reproductive: Prior hysterectomy.  No adnexal masses. Other: No free fluid or free air.  Umbilical hernia containing fat. Musculoskeletal: No acute bony abnormality or focal bone lesion. IMPRESSION: Diffuse colonic diverticulosis. Improving but continued mild inflammatory change around the ascending colon compatible with diverticulitis. No focal fluid collection or free air. Cholelithiasis. Mild pericholecystic fluid around the gallbladder. While this could be related to the inflammatory process/ diverticulitis in the right upper quadrant, I  cannot exclude cholecystitis. Consider ultrasound if there is clinical concern for cholecystitis. Electronically Signed   By: Charlett NoseKevin  Dover M.D.   On: 05/08/2016 07:36   [place] Abdomen Limited Ruq  05/08/2016  CLINICAL DATA:  Right upper quadrant pain beginning last night. Initial encounter.  EXAM: US ABDOMEN LIMITED - RIGHT UPPER QUADRANT COMPARISON:  CT abdomen and pelvis this same day and 04/19/2016. Right upper quadrant ultrasound 04/19/2016. FINDINGS: Gallbladder: There is pericholecystic fluid. Gallbladder wall thickness is 0.4 cm. Several small stones are identified measuring up to 0.6 cm. The gallbladder is distended. Common bile duct: Diameter: 0.2 cm. Liver: No focal lesion is identified. Nodularity of the left lobe of the liver is identified. IMPRESSION: Findings compatible with acute cholecystitis. Nodular appearance of the border of the left hepatic lobe worrisome for cirrhosis. Electronically Signed   By: Drusilla Kannerhomas  Dalessio M.D.   On: 05/08/2016 09:02    Anti-infectives: Anti-infectives    Start     Dose/Rate Route Frequency Ordered Stop   05/08/16 1800  piperacillin-tazobactam (ZOSYN) IVPB 3.375 g     3.375 g 12.5 mL/hr over 240 Minutes Intravenous Every 8 hours 05/08/16 1325     05/08/16 1045  piperacillin-tazobactam (ZOSYN) IVPB 3.375 g  Status:  Discontinued     3.375 g 12.5 mL/hr over 240 Minutes Intravenous Every 8 hours 05/08/16 1039 05/08/16 1325   05/08/16 0930  cefTRIAXone (ROCEPHIN) 2 g in dextrose 5 % 50 mL IVPB     2 g 100 mL/hr over 30 Minutes Intravenous  Once 05/08/16 0928 05/08/16 1054       Assessment/Plan Right sided diverticulitis - improved from previous studies Enlarged gallbladder with chronic calcified stones Leukocytosis - normal today at 6.6 -Medical management for now, hard to determine if diverticulitis is secondarily causing inflammatory changes of the gallbladder. LFT's are normal. Could just be continued diverticulitis vs addition of cholecystitis. Consider HIDA if not improving.  -Clinical improvement today so will start clears -Red IVF, pain control, antiemetics, antibiotics (Zosyn Day #2) -SCD's and lovenox for DVT proph -Ambulate and IS -Home meds  Tobacco abuse - h/o 2ppd, wanting to quit       Nonie HoyerMegan N  Versa Craton 05/09/2016, 7:12 AM Pager: [phone]  (7am - 4:30pm M-F; 7am - 11:30am Sa/Su)

## 2016-05-10 MED ORDER — ALUM & MAG HYDROXIDE-SIMETH 200-200-20 MG/5ML PO SUSP
30.0000 mL | Freq: Once | ORAL | Status: AC
  Administered 2016-05-10: 30 mL via ORAL
  Filled 2016-05-10: qty 30

## 2016-05-10 MED ORDER — SODIUM CHLORIDE 0.9% FLUSH: 3.0000 mL | INTRAVENOUS | Status: DC | PRN

## 2016-05-10 MED ORDER — SODIUM CHLORIDE 0.9% FLUSH
3.0000 mL | Freq: Two times a day (BID) | INTRAVENOUS | Status: DC
  Administered 2016-05-10 – 2016-05-11 (×2): 3 mL

## 2016-05-10 NOTE — Progress Notes (Signed)
Patient ID: Candice Roy, female   DOB: 01/13/1956, 60 y.o.   MRN: [id number]     [place]      [address] [place], [place] [address]    Phone: [phone] FAX: [phone]     Subjective: Developed cp around 1am.  No sob, arm pain or nausea. Not associated with activity.  No pressure. Pain has improved.  Tolerating clears.    Objective:  Vital signs:  Filed Vitals:   05/09/16 1010 05/09/16 1339 05/09/16 2143 05/10/16 0535  BP: 149/81 144/83 144/96 149/94  Pulse: 66 72 66 65  Temp: 98.2 F (36.8 C)  98.3 F (36.8 C) 97.9 F (36.6 C)  TempSrc: Oral  Oral Oral  Resp: '20 18 20 18  ' Height:      Weight:      SpO2: 93% 96% 98% 96%    Last BM Date: 05/09/16  Intake/Output   Yesterday:  06/06 0701 - 06/07 0700 In: 462 [P.O.:462] Out: -  This shift:    I/O last 3 completed shifts: In: 2247 [P.O.:462; I.V.:1685; IV Piggyback:100] Out: 800 [Urine:800]   Physical Exam: General: Pt awake/alert/oriented x4 in no acute distress Chest: cta.  No chest wall pain w good excursion CV:  Pulses intact.  Regular rhythm MS: Normal AROM mjr joints.  No obvious deformity Abdomen: Soft.  Nondistended.  ttp ruq, positive murphy's sign.  No evidence of peritonitis.  No incarcerated hernias.    Problem List:   Active Problems:   Diverticulitis of colon   Leukocytosis   Diverticulitis, colon    Results:   Labs: Results for orders placed or performed during the hospital encounter of 05/08/16 (from the past 48 hour(s))  Comprehensive metabolic panel     Status: Abnormal   Collection Time: 05/09/16  5:32 AM  Result Value Ref Range   Sodium 137 135 - 145 mmol/L   Potassium 4.0 3.5 - 5.1 mmol/L   Chloride 105 101 - 111 mmol/L   CO2 25 22 - 32 mmol/L   Glucose, Bld 86 65 - 99 mg/dL   BUN 7 6 - 20 mg/dL   Creatinine, Ser 0.84 0.44 - 1.00 mg/dL   Calcium 8.5 (L) 8.9 - 10.3 mg/dL   Total Protein 6.9 6.5 - 8.1 g/dL   Albumin 3.3 (L) 3.5 - 5.0 g/dL   AST 27 15 - 41 U/L   ALT 16 14 - 54 U/L   Alkaline Phosphatase 49 38 - 126 U/L   Total Bilirubin 0.7 0.3 - 1.2 mg/dL   GFR calc non Af Amer >60 >60 mL/min   GFR calc Af Amer >60 >60 mL/min    Comment: (NOTE) The eGFR has been calculated using the CKD EPI equation. This calculation has not been validated in all clinical situations. eGFR's persistently <60 mL/min signify possible Chronic Kidney Disease.    Anion gap 7 5 - 15  CBC     Status: Abnormal   Collection Time: 05/09/16  5:32 AM  Result Value Ref Range   WBC 6.6 4.0 - 10.5 K/uL   RBC 3.54 (L) 3.87 - 5.11 MIL/uL   Hemoglobin 10.6 (L) 12.0 - 15.0 g/dL   HCT 32.3 (L) 36.0 - 46.0 %   MCV 91.2 78.0 - 100.0 fL   MCH 29.9 26.0 - 34.0 pg   MCHC 32.8 30.0 - 36.0 g/dL   RDW 16.7 (H) 11.5 - 15.5 %   Platelets 241 150 - 400 K/uL  Imaging / Studies: US Abdomen Limited Ruq  2016/05/18  CLINICAL DATA:  Right upper quadrant pain beginning last night. Initial encounter. EXAM: US ABDOMEN LIMITED - RIGHT UPPER QUADRANT COMPARISON:  CT abdomen and pelvis this same day and 04/19/2016. Right upper quadrant ultrasound 04/19/2016. FINDINGS: Gallbladder: There is pericholecystic fluid. Gallbladder wall thickness is 0.4 cm. Several small stones are identified measuring up to 0.6 cm. The gallbladder is distended. Common bile duct: Diameter: 0.2 cm. Liver: No focal lesion is identified. Nodularity of the left lobe of the liver is identified. IMPRESSION: Findings compatible with acute cholecystitis. Nodular appearance of the border of the left hepatic lobe worrisome for cirrhosis. Electronically Signed   By: Inge Rise M.D.   On: May 18, 2016 09:02    Medications / Allergies:  Scheduled Meds: . enoxaparin (LOVENOX) injection  40 mg Subcutaneous Q24H  . famotidine  20 mg Oral BID  . piperacillin-tazobactam (ZOSYN)  IV  3.375 g Intravenous Q8H   Continuous Infusions: . 0.9 % NaCl with KCl 20 mEq / L 75 mL/hr at  05/10/16 0758   PRN Meds:.acetaminophen **OR** acetaminophen, albuterol, diphenhydrAMINE **OR** diphenhydrAMINE, hydrALAZINE, HYDROcodone-acetaminophen, HYDROmorphone (DILAUDID) injection, methocarbamol, ondansetron, ondansetron, simethicone  Antibiotics: Anti-infectives    Start     Dose/Rate Route Frequency Ordered Stop   May 18, 2016 1800  piperacillin-tazobactam (ZOSYN) IVPB 3.375 g     3.375 g 12.5 mL/hr over 240 Minutes Intravenous Every 8 hours 2016-05-18 1325     May 18, 2016 1045  piperacillin-tazobactam (ZOSYN) IVPB 3.375 g  Status:  Discontinued     3.375 g 12.5 mL/hr over 240 Minutes Intravenous Every 8 hours 05-18-16 1039 May 18, 2016 1325   May 18, 2016 0930  cefTRIAXone (ROCEPHIN) 2 g in dextrose 5 % 50 mL IVPB     2 g 100 mL/hr over 30 Minutes Intravenous  Once 2016/05/18 0928 05-18-16 1054       Assessment/Plan Right sided diverticulitis - improved from previous studies Enlarged gallbladder with chronic calcified stones Leukocytosis -afebrile, tolerating clears.  Advance to fulls and continue to follow clinically.  Would probably benefit from a cholecystectomy at some point Chest pains-atypical, ekg without st changes.  Give mylanta.  Continue PPI and continue to monitor.  ID-zosyn VTE prophylaxis-scd, lovenox FEN-fulls  Dispo-pain   Erby Pian, ANP-BC [facility] Surgery Pager [phone](7A-4:30P)   05/10/2016 8:56 AM

## 2016-05-10 NOTE — Care Management Note (Signed)
Case Management Note  Patient Details  Name: Candice Roy MRN: [id number] Date of Birth: [birth year]  Subjective/Objective:             abd pain with fever and positive abd films for diverticulitis       Action/Plan:Date:  May 10, 2016 Chart reviewed for concurrent status and case management needs. Will continue to follow the patient for changes and needs: Expected discharge date: [id number] Marcelle SmilingRhonda Chozen [place]ip[place], BSN, [place], [place]   [phone]   Expected Discharge Date:   (unknown)               Expected Discharge Plan:  Home/Self Care  In-House Referral:  NA  Discharge planning Services  CM Consult  Post Acute Care Choice:  NA Choice offered to:  NA  DME Arranged:    DME Agency:     HH Arranged:    HH Agency:     Status of Service:  In process, will continue to follow  Medicare Important Message Given:    Date Medicare IM Given:    Medicare IM give by:    Date Additional Medicare IM Given:    Additional Medicare Important Message give by:     If discussed at Long Length of Stay Meetings, dates discussed:    Additional Comments:  Golda AcreDavis, Elke Holtry Lynn, RN 05/10/2016, 10:36 AM

## 2016-05-11 LAB — CBC
HEMATOCRIT: 32.4 % — AB (ref 36.0–46.0)
Hemoglobin: 11.1 g/dL — ABNORMAL LOW (ref 12.0–15.0)
MCH: 30.4 pg (ref 26.0–34.0)
MCHC: 34.3 g/dL (ref 30.0–36.0)
MCV: 88.8 fL (ref 78.0–100.0)
PLATELETS: 213 10*3/uL (ref 150–400)
RBC: 3.65 MIL/uL — AB (ref 3.87–5.11)
RDW: 16.2 % — AB (ref 11.5–15.5)
WBC: 6.9 10*3/uL (ref 4.0–10.5)

## 2016-05-11 MED ORDER — AMOXICILLIN-POT CLAVULANATE 875-125 MG PO TABS
1.0000 | ORAL_TABLET | Freq: Two times a day (BID) | ORAL | Status: DC
  Administered 2016-05-11 – 2016-05-12 (×3): 1 via ORAL
  Filled 2016-05-11 (×4): qty 1

## 2016-05-11 MED ORDER — SACCHAROMYCES BOULARDII 250 MG PO CAPS
250.0000 mg | ORAL_CAPSULE | Freq: Two times a day (BID) | ORAL | Status: DC
  Administered 2016-05-11 (×2): 250 mg via ORAL
  Filled 2016-05-11 (×4): qty 1

## 2016-05-11 NOTE — Progress Notes (Signed)
Patient ID: Candice Roy, female   DOB: 1956-05-17, 60 y.o.   MRN: [id number]     [facility]      [address]   [place], [place] [address]    Phone: [phone] FAX: [phone]     Subjective: Tolerating fulls.  Pain is improved.  Having bowel function.  No n/v. Afebrile.  Vss.    Objective:  Vital signs:  Filed Vitals:   05/10/16 1000 05/10/16 1405 05/10/16 2133 05/11/16 0525  BP: 149/86 147/69 138/78 147/67  Pulse: 60 61 61 59  Temp: 98.1 F (36.7 C) 97.9 F (36.6 C) 98.1 F (36.7 C) 98.1 F (36.7 C)  TempSrc: Oral Oral Oral Oral  Resp: Height:      Weight:      SpO2: 98% 98% 98% 99%    Last BM Date: 05/10/16  Intake/Output   Yesterday:  06/07 0701 - 06/08 0700 In: 520 [P.O.:220; IV Piggyback:300] Out: -  This shift:    I/O last 3 completed shifts: In: 760 [P.O.:460; IV Piggyback:300] Out: -      Physical Exam: General: Pt awake/alert/oriented x4 in no acute distress Chest: cta. No chest wall pain w good excursion CV: Pulses intact. Regular rhythm MS: Normal AROM mjr joints. No obvious deformity Abdomen: Soft. Nondistended. ttp ruq, positive murphy's sign. No evidence of peritonitis. No incarcerated hernias.    Problem List:   Active Problems:   Diverticulitis of colon   Leukocytosis   Diverticulitis, colon    Results:   Labs: Results for orders placed or performed during the hospital encounter of 05/08/16 (from the past 48 hour(s))  CBC     Status: Abnormal   Collection Time: 05/11/16  4:45 AM  Result Value Ref Range   WBC 6.9 4.0 - 10.5 K/uL   RBC 3.65 (L) 3.87 - 5.11 MIL/uL   Hemoglobin 11.1 (L) 12.0 - 15.0 g/dL   HCT 25.3 (L) 66.4 - 40.3 %   MCV 88.8 78.0 - 100.0 fL   MCH 30.4 26.0 - 34.0 pg   MCHC 34.3 30.0 - 36.0 g/dL   RDW 47.4 (H) 25.9 - 56.3 %   Platelets 213 150 - 400 K/uL    Comment: SPECIMEN CHECKED FOR CLOTS PLATELET COUNT CONFIRMED BY SMEAR      Imaging / Studies: No results found.  Medications / Allergies:  Scheduled Meds: . enoxaparin (LOVENOX) injection  40 mg Subcutaneous Q24H  . famotidine  20 mg Oral BID  . piperacillin-tazobactam (ZOSYN)  IV  3.375 g Intravenous Q8H  . sodium chloride flush  3 mL Intracatheter Q12H   Continuous Infusions:  PRN Meds:.acetaminophen **OR** acetaminophen, albuterol, diphenhydrAMINE **OR** diphenhydrAMINE, hydrALAZINE, HYDROcodone-acetaminophen, HYDROmorphone (DILAUDID) injection, methocarbamol, ondansetron, ondansetron, simethicone, sodium chloride flush  Antibiotics: Anti-infectives    Start     Dose/Rate Route Frequency Ordered Stop   05/08/16 1800  piperacillin-tazobactam (ZOSYN) IVPB 3.375 g     3.375 g 12.5 mL/hr over 240 Minutes Intravenous Every 8 hours 05/08/16 1325     05/08/16 1045  piperacillin-tazobactam (ZOSYN) IVPB 3.375 g  Status:  Discontinued     3.375 g 12.5 mL/hr over 240 Minutes Intravenous Every 8 hours 05/08/16 1039 05/08/16 1325   05/08/16 0930  cefTRIAXone (ROCEPHIN) 2 g in dextrose 5 % 50 mL IVPB     2 g 100 mL/hr over 30 Minutes Intravenous  Once 05/08/16 0928 05/08/16 1054  Assessment/Plan Right sided diverticulitis - improved from previous studies Enlarged gallbladder with chronic calcified stones Leukocytosis -afebrile, tolerating fulls. Advance to soft diet, change to PO antibiotics, [facility]. Would probably benefit from a cholecystectomy at some point unless she acutely worsens ID-zosyn.  Change to augmentin, add florastor VTE prophylaxis-scd, lovenox FEN-soft diet Dispo-possibly 24-48h pending diet advancement and pain  Ashok NorrisEmina Makyra Corprew, ANP-BC [facility] Pager [phone]([id number]:30P)   05/11/2016 9:01 AM

## 2016-05-12 LAB — CBC
HCT: 33 % — ABNORMAL LOW (ref 36.0–46.0)
HEMOGLOBIN: 11.2 g/dL — AB (ref 12.0–15.0)
MCH: 30.2 pg (ref 26.0–34.0)
MCHC: 33.9 g/dL (ref 30.0–36.0)
MCV: 88.9 fL (ref 78.0–100.0)
Platelets: 235 10*3/uL (ref 150–400)
RBC: 3.71 MIL/uL — AB (ref 3.87–5.11)
RDW: 16.2 % — ABNORMAL HIGH (ref 11.5–15.5)
WBC: 8.1 10*3/uL (ref 4.0–10.5)

## 2016-05-12 LAB — COMPREHENSIVE METABOLIC PANEL
ALBUMIN: 3.6 g/dL (ref 3.5–5.0)
ALK PHOS: 57 U/L (ref 38–126)
ALT: 12 U/L — AB (ref 14–54)
ANION GAP: 7 (ref 5–15)
AST: 30 U/L (ref 15–41)
BUN: 6 mg/dL (ref 6–20)
CALCIUM: 9.3 mg/dL (ref 8.9–10.3)
CHLORIDE: 105 mmol/L (ref 101–111)
CO2: 25 mmol/L (ref 22–32)
Creatinine, Ser: 0.7 mg/dL (ref 0.44–1.00)
GFR calc Af Amer: >60 mL/min (ref >60)
GFR calc non Af Amer: >60 mL/min (ref >60)
GLUCOSE: 90 mg/dL (ref 65–99)
Potassium: 4 mmol/L (ref 3.5–5.1)
SODIUM: 137 mmol/L (ref 135–145)
Total Bilirubin: 0.8 mg/dL (ref 0.3–1.2)
Total Protein: 7.2 g/dL (ref 6.5–8.1)

## 2016-05-12 MED ORDER — SACCHAROMYCES BOULARDII 250 MG PO CAPS: 250.0000 mg | ORAL_CAPSULE | Freq: Two times a day (BID) | ORAL | Status: DC

## 2016-05-12 MED ORDER — AMOXICILLIN-POT CLAVULANATE 875-125 MG PO TABS: 1.0000 | ORAL_TABLET | Freq: Two times a day (BID) | ORAL | Status: DC

## 2016-05-12 NOTE — Progress Notes (Signed)
Date:  May 12, 2016 Chart reviewed for concurrent status and case management needs. Will continue to follow the patient for changes and needs:  No discharge needs present at time of dc to home. Expected discharge date: [id number] Marcelle SmilingRhonda Joey Lierman, BSN, [place]3, [place]   [phone]

## 2016-05-12 NOTE — Discharge Summary (Signed)
[facility] Discharge Summary   Patient ID: Candice Roy MRN: [id number] DOB/AGE: March 30, 1956 60 y.o.  Admit date: 05/08/2016 Discharge date: 05/12/2016  Admitting Diagnosis: Right colon diverticulitis Abnormal gallbladder  Discharge Diagnosis Patient Active Problem List   Diagnosis Date Noted  . Diverticulitis of colon 05/08/2016  . Leukocytosis 05/08/2016  . Diverticulitis, colon 05/08/2016  . Acute diverticulitis 04/19/2016  . NASH (nonalcoholic steatohepatitis) with elevated LFTs 04/19/2016  . Tobacco abuse disorder 04/19/2016  . Hypokalemia 04/19/2016  . Hypertension 04/19/2016  . Periorbital edema of left eye 04/19/2016  . Hypomagnesemia 04/19/2016  . Hypophosphatemia 04/19/2016  . Bulging disc   . Fatty liver     Consultants None  Imaging: No results found.  Procedures None  Hospital Course:  60 y/o AA female with PMH of diverticulitis, c-section, fatty liver/NASH, HTN, and bulging disc. [facility]. Of note she was recently hospitalized between 04/19/16 to 04/22/16 for right sided diverticulitis. She felt fine yesterday, but was acutely awoken from her sleep at 1am with severe RUQ abdominal pain. She had a large episode of vomiting in the waiting room here in the ED but otherwise denies feeling nauseous or having any other episodes of vomiting. She had a strained BM this morning at 1am. She denies having diarrhea, fever/chills, dysuria. She says the pain feels the same as the last time she was here. She denies any hematochezia or melana, but did have this on last admission. She has a asymptomatic umbilical hernia. Past surgical h/o includes appendectomy, c-section, and hysterectomy. Used to smoke 2ppd, but has cut back to 1 pack every couple of days. She would like to quit.   CT shows improving right sided diverticulitis, leukocytosis, and cholelithiasis with pericholecystic fluid  concerning for secondary inflammatory process or acute cholecystitis. LFT's are normal.   Patient was admitted and was transferred to the floor for conservative management.  It was thought that her diverticulitis was recurrent and was causing her gallbladder to look abnormal.  However, her LFT's were elevated at last admission and could also have coinciding cholecystitis.  We treated her conservatively and she improved.  Diet was advanced as tolerated without relapse in pain.  On HD #4, the patient was voiding well, tolerating diet, ambulating well, pain well controlled, vital signs stable, incisions c/d/i and felt stable for discharge home.  Patient will follow up in our office in 2-3 weeks and knows to call with questions or concerns.    Physical Exam: General:  Alert, NAD, pleasant, comfortable Abd:  Soft, ND, NT, no HSM     Medication List    TAKE these medications        albuterol 108 (90 Base) MCG/ACT inhaler  Commonly known as:  PROVENTIL HFA;VENTOLIN HFA  Inhale 1-2 puffs into the lungs every 6 (six) hours as needed for wheezing or shortness of breath.     amoxicillin-clavulanate 875-125 MG tablet  Commonly known as:  AUGMENTIN  Take 1 tablet by mouth every 12 (twelve) hours.     diphenhydrAMINE 50 MG tablet  Commonly known as:  BENADRYL  Take 0.5 tablets (25 mg total) by mouth every 8 (eight) hours.     famotidine 20 MG tablet  Commonly known as:  PEPCID  Take 20 mg by mouth 2 (two) times daily.     loperamide 2 MG tablet  Commonly known as:  IMODIUM A-D  Take 1 tablet (2 mg total) by mouth 4 (four)  times daily as needed for diarrhea or loose stools.     naproxen sodium 220 MG tablet  Commonly known as:  ANAPROX  Take 440 mg by mouth 2 (two) times daily as needed (for pain.).     saccharomyces boulardii 250 MG capsule  Commonly known as:  FLORASTOR  Take 1 capsule (250 mg total) by mouth 2 (two) times daily.         Follow-up Information    Follow up with  Valarie Merino, MD. Go on 05/26/2016.   Specialty:  General Surgery   Why:  For post-hospital follow up.  Dr Daphine Deutscher on 05/26/16 at 10:15 am arriving at 9:45 am    Contact information:   [address] [place] [address] [phone]       Signed: Nonie Hoyer, [facility] [phone]  05/12/2016, 10:24 AM

## 2016-05-12 NOTE — Care Management Important Message (Signed)
Important Message  Patient Details  Name: Candice Roy Belle MRN: [id number] Date of Birth: [birth year]   Medicare Important Message Given:  Yes    Haskell FlirtJamison, Nashay Brickley 05/12/2016, 11:08 AMImportant Message  Patient Details  Name: Candice Roy Hugill MRN: [id number] Date of Birth: [birth year]   Medicare Important Message Given:  Yes    Haskell FlirtJamison, Rennie Hack 05/12/2016, 11:08 AM

## 2016-05-12 NOTE — Discharge Instructions (Addendum)
Low-Fiber Diet (FOR 6-8 WEEKS) Fiber is found in fruits, vegetables, and whole grains. A low-fiber diet restricts fibrous foods that are not digested in the small intestine. A diet containing about 10-15 grams of fiber per day is considered low fiber. Low-fiber diets may be used to:  Promote healing and rest the bowel during intestinal flare-ups.  Prevent blockage of a partially obstructed or narrowed gastrointestinal tract.  Reduce fecal weight and volume.  Slow the movement of feces. You may be on a low-fiber diet as a transitional diet following surgery, after an injury (trauma), or because of a short (acute) or lifelong (chronic) illness. Your health care provider will determine the length of time you need to stay on this diet.  WHAT DO I NEED TO KNOW ABOUT A LOW-FIBER DIET? Always check the fiber content on the packaging's Nutrition Facts label, especially on foods from the grains list. Ask your dietitian if you have questions about specific foods that are related to your condition, especially if the food is not listed below. In general, a low-fiber food will have less than 2 g of fiber. WHAT FOODS CAN I EAT? Grains All breads and crackers made with white flour. Sweet rolls, doughnuts, waffles, pancakes, [place] toast, bagels. Pretzels, Melba toast, zwieback. Well-cooked cereals, such as cornmeal, farina, or cream cereals. Dry cereals that do not contain whole grains, fruit, or nuts, such as refined corn, wheat, rice, and oat cereals. Potatoes prepared any way without skins, plain pastas and noodles, refined white rice. Use white flour for baking and making sauces. Use allowed list of grains for casseroles, dumplings, and puddings.  Vegetables Strained tomato and vegetable juices. Fresh lettuce, cucumber, spinach. Well-cooked (no skin or pulp) or canned vegetables, such as asparagus, bean sprouts, beets, carrots, green beans, mushrooms, potatoes, pumpkin, spinach, yellow squash, tomato  sauce/puree, turnips, yams, and zucchini. Keep servings limited to  cup.  Fruits All fruit juices except prune juice. Cooked or canned fruits without skin and seeds, such as applesauce, apricots, cherries, fruit cocktail, grapefruit, grapes, mandarin oranges, melons, peaches, pears, pineapple, and plums. Fresh fruits without skin, such as apricots, avocados, bananas, melons, pineapple, nectarines, and peaches. Keep servings limited to  cup or 1 piece.  Meat and Other Protein Sources Ground or well-cooked tender beef, ham, veal, lamb, pork, or poultry. Eggs, plain cheese. Fish, oysters, shrimp, lobster, and other seafood. Liver, organ meats. Smooth nut butters. Dairy All milk products and alternative dairy substitutes, such as soy, rice, almond, and coconut, not containing added whole nuts, seeds, or added fruit. Beverages Decaf coffee, fruit, and vegetable juices or smoothies (small amounts, with no pulp or skins, and with fruits from allowed list), sports drinks, herbal tea. Condiments Ketchup, mustard, vinegar, cream sauce, cheese sauce, cocoa powder. Spices in moderation, such as allspice, basil, bay leaves, celery powder or leaves, cinnamon, cumin powder, curry powder, ginger, mace, marjoram, onion or garlic powder, oregano, paprika, parsley flakes, ground pepper, rosemary, sage, savory, tarragon, thyme, and turmeric. Sweets and Desserts Plain cakes and cookies, pie made with allowed fruit, pudding, custard, cream pie. Gelatin, fruit, ice, sherbet, frozen ice pops. Ice cream, ice milk without nuts. Plain hard candy, honey, jelly, molasses, syrup, sugar, chocolate syrup, gumdrops, marshmallows. Limit overall sugar intake.  Fats and Oil Margarine, butter, cream, mayonnaise, salad oils, plain salad dressings made from allowed foods. Choose healthy fats such as olive oil, canola oil, and omega-3 fatty acids (such as found in salmon or tuna) when possible.  Other Bouillon, broth, or  cream soups  made from allowed foods. Any strained soup. Casseroles or mixed dishes made with allowed foods. The items listed above may not be a complete list of recommended foods or beverages. Contact your dietitian for more options.  WHAT FOODS ARE NOT RECOMMENDED? Grains All whole wheat and whole grain breads and crackers. Multigrains, rye, bran seeds, nuts, or coconut. Cereals containing whole grains, multigrains, bran, coconut, nuts, raisins. Cooked or dry oatmeal, steel-cut oats. Coarse wheat cereals, granola. Cereals advertised as high fiber. Potato skins. Whole grain pasta, wild or brown rice. Popcorn. Coconut flour. Bran, buckwheat, corn bread, multigrains, rye, wheat germ.  Vegetables Fresh, cooked or canned vegetables, such as artichokes, asparagus, beet greens, broccoli, Brussels sprouts, cabbage, celery, cauliflower, corn, eggplant, kale, legumes or beans, okra, peas, and tomatoes. Avoid large servings of any vegetables, especially raw vegetables.  Fruits Fresh fruits, such as apples with or without skin, berries, cherries, figs, grapes, grapefruit, guavas, kiwis, mangoes, oranges, papayas, pears, persimmons, pineapple, and pomegranate. Prune juice and juices with pulp, stewed or dried prunes. Dried fruits, dates, raisins. Fruit seeds or skins. Avoid large servings of all fresh fruits. Meats and Other Protein Sources Tough, fibrous meats with gristle. Chunky nut butter. Cheese made with seeds, nuts, or other foods not recommended. Nuts, seeds, legumes (beans, including baked beans), dried peas, beans, lentils.  Dairy Yogurt or cheese that contains nuts, seeds, or added fruit.  Beverages Fruit juices with high pulp, prune juice. Caffeinated coffee and teas.  Condiments Coconut, maple syrup, pickles, olives. Sweets and Desserts Desserts, cookies, or candies that contain nuts or coconut, chunky peanut butter, dried fruits. Jams, preserves with seeds, marmalade. Large amounts of sugar and sweets. Any  other dessert made with fruits from the not recommended list.  Other Soups made from vegetables that are not recommended or that contain other foods not recommended.  The items listed above may not be a complete list of foods and beverages to avoid. Contact your dietitian for more information.   This information is not intended to replace advice given to you by your health care provider. Make sure you discuss any questions you have with your health care provider.   Document Released: 05/12/2002 Document Revised: 11/25/2013 Document Reviewed: 10/13/2013 Elsevier Interactive Patient Education 2016 Else[employer] Inc[employer]   Cholecystitis Cholecystitis is inflammation of the gallbladder. It is often called a gallbladder attack. The gallbladder is a pear-shaped organ that lies beneath the liver on the right side of the body. The gallbladder stores bile, which is a fluid that helps the body to digest fats. If bile builds up in your gallbladder, your gallbladder becomes inflamed. This condition may occur suddenly (be acute). Repeat episodes of acute cholecystitis or prolonged episodes may lead to a long-term (chronic) condition. Cholecystitis is serious and it requires treatment.  CAUSES The most common cause of this condition is gallstones. Gallstones can block the tube (duct) that carries bile out of your gallbladder. This causes bile to build up. Other causes of this condition include:  Damage to the gallbladder due to a decrease in blood flow.  Infections in the bile ducts.  Scars or kinks in the bile ducts.  Tumors in the liver, pancreas, or gallbladder. RISK FACTORS This condition is more likely to develop in:  People who have sickle cell disease.  People who take birth control pills or use estrogen.  People who have alcoholic liver disease.  People who have liver cirrhosis.  People who have their nutrition delivered through a vein (parenteral  nutrition).  People who do not eat or drink (do  fasting) for a long period of time.  People who are obese.  People who have rapid weight loss.  People who are pregnant.  People who have increased triglyceride levels.  People who have pancreatitis. SYMPTOMS Symptoms of this condition include:  Abdominal pain, especially in the upper right area of the abdomen.  Abdominal tenderness or bloating.  Nausea.  Vomiting.  Fever.  Chills.  Yellowing of the skin and the whites of the eyes (jaundice). DIAGNOSIS This condition is diagnosed with a medical history and physical exam. You may also have other tests, including:  Imaging tests, such as:  An ultrasound of the gallbladder.  A CT scan of the abdomen.  A gallbladder nuclear scan (HIDA scan). This scan allows your health care provider to see the bile moving from your liver to your gallbladder and to your small intestine.  MRI.  Blood tests, such as:  A complete blood count, because the white blood cell count may be higher than normal.  Liver function tests, because some levels may be higher than normal with certain types of gallstones. TREATMENT Treatment may include:  Fasting for a certain amount of time.  IV fluids.  Medicine to treat pain or vomiting.  Antibiotic medicine.  Surgery to remove your gallbladder (cholecystectomy). This may happen immediately or at a later time. HOME CARE INSTRUCTIONS Home care will depend on your treatment. In general:  Take over-the-counter and prescription medicines only as told by your health care provider.  If you were prescribed an antibiotic medicine, take it as told by your health care provider. Do not stop taking the antibiotic even if you start to feel better.  Follow instructions from your health care provider about what to eat or drink. When you are allowed to eat, avoid eating or drinking anything that triggers your symptoms.  Keep all follow-up visits as told by your health care provider. This is  important. SEEK MEDICAL CARE IF:  Your pain is not controlled with medicine.  You have a fever. SEEK IMMEDIATE MEDICAL CARE IF:  Your pain moves to another part of your abdomen or to your back.  You continue to have symptoms or you develop new symptoms even with treatment.   This information is not intended to replace advice given to you by your health care provider. Make sure you discuss any questions you have with your health care provider.   Document Released: 11/20/2005 Document Revised: 08/11/2015 Document Reviewed: 03/03/2015 Elsevier Interactive Patient Education 2016 [employer].   Diverticulitis Diverticulitis is inflammation or infection of small pouches in your colon that form when you have a condition called diverticulosis. The pouches in your colon are called diverticula. Your colon, or large intestine, is where water is absorbed and stool is formed. Complications of diverticulitis can include:  Bleeding.  Severe infection.  Severe pain.  Perforation of your colon.  Obstruction of your colon. CAUSES  Diverticulitis is caused by bacteria. Diverticulitis happens when stool becomes trapped in diverticula. This allows bacteria to grow in the diverticula, which can lead to inflammation and infection. RISK FACTORS People with diverticulosis are at risk for diverticulitis. Eating a diet that does not include enough fiber from fruits and vegetables may make diverticulitis more likely to develop. SYMPTOMS  Symptoms of diverticulitis may include:  Abdominal pain and tenderness. The pain is normally located on the left side of the abdomen, but may occur in other areas.  Fever and chills.  Bloating.  Cramping.  Nausea.  Vomiting.  Constipation.  Diarrhea.  Blood in your stool. DIAGNOSIS  Your health care provider will ask you about your medical history and do a physical exam. You may need to have tests done because many medical conditions can cause the same  symptoms as diverticulitis. Tests may include:  Blood tests.  Urine tests.  Imaging tests of the abdomen, including X-rays and CT scans. When your condition is under control, your health care provider may recommend that you have a colonoscopy. A colonoscopy can show how severe your diverticula are and whether something else is causing your symptoms. TREATMENT  Most cases of diverticulitis are mild and can be treated at home. Treatment may include:  Taking over-the-counter pain medicines.  Following a clear liquid diet.  Taking antibiotic medicines by mouth for 7-10 days. More severe cases may be treated at a hospital. Treatment may include:  Not eating or drinking.  Taking prescription pain medicine.  Receiving antibiotic medicines through an IV tube.  Receiving fluids and nutrition through an IV tube.  Surgery. HOME CARE INSTRUCTIONS   Follow your health care provider's instructions carefully.  Follow a full liquid diet or other diet as directed by your health care provider. After your symptoms improve, your health care provider may tell you to change your diet. He or she may recommend you eat a high-fiber diet. Fruits and vegetables are good sources of fiber. Fiber makes it easier to pass stool.  Take fiber supplements or probiotics as directed by your health care provider.  Only take medicines as directed by your health care provider.  Keep all your follow-up appointments. SEEK MEDICAL CARE IF:   Your pain does not improve.  You have a hard time eating food.  Your bowel movements do not return to normal. SEEK IMMEDIATE MEDICAL CARE IF:   Your pain becomes worse.  Your symptoms do not get better.  Your symptoms suddenly get worse.  You have a fever.  You have repeated vomiting.  You have bloody or black, tarry stools. MAKE SURE YOU:   Understand these instructions.  Will watch your condition.  Will get help right away if you are not doing well or get  worse.   This information is not intended to replace advice given to you by your health care provider. Make sure you discuss any questions you have with your health care provider.   Document Released: 08/30/2005 Document Revised: 11/25/2013 Document Reviewed: 10/15/2013 Elsevier Interactive Patient Education [employer]

## 2017-03-28 ENCOUNTER — Other Ambulatory Visit: Payer: Self-pay | Admitting: Oral Surgery

## 2017-04-04 ENCOUNTER — Encounter (HOSPITAL_COMMUNITY): Payer: Self-pay

## 2017-04-04 ENCOUNTER — Emergency Department (HOSPITAL_COMMUNITY): Payer: Medicare Other

## 2017-04-04 ENCOUNTER — Emergency Department (HOSPITAL_COMMUNITY)
Admission: EM | Admit: 2017-04-04 | Discharge: 2017-04-04 | Disposition: A | Discharge status: Home / Self Care | Payer: Medicare Other | Attending: Emergency Medicine | Admitting: Emergency Medicine

## 2017-04-04 DIAGNOSIS — M79674 Pain in right toe(s): Secondary | ICD-10-CM | POA: Diagnosis not present

## 2017-04-04 DIAGNOSIS — F17200 Nicotine dependence, unspecified, uncomplicated: Secondary | ICD-10-CM | POA: Insufficient documentation

## 2017-04-04 DIAGNOSIS — I10 Essential (primary) hypertension: Secondary | ICD-10-CM | POA: Diagnosis not present

## 2017-04-04 LAB — CBG MONITORING, ED: Glucose-Capillary: 108 mg/dL — ABNORMAL HIGH (ref 65–99)

## 2017-04-04 MED ORDER — GABAPENTIN 300 MG PO CAPS: 300.0000 mg | ORAL_CAPSULE | Freq: Every day | ORAL | 0 refills | Status: DC

## 2017-04-04 NOTE — ED Provider Notes (Signed)
[facility]-EMERGENCY DEPT Provider Note   CSN: [id number] Arrival date & time: 04/04/17  0620     History   Chief Complaint Chief Complaint  Patient presents with  . Toe Pain    HPI Candice Roy is a 61 y.o. female.  Pt presents to the ED today with right toe pain.  It has been hurting for about 1 week.  She also said that she has some weird sensations to the bottom of her foot.  She has recently been told by her doctor that she was a borderline diabetic.  She is going to see the nutritionist soon.  The pt is not currently on any diabetic meds.  Pt denies any trauma to her toe.      Past Medical History:  Diagnosis Date  . Bulging disc   . Diverticulitis   . Fatty liver   . Hypertension     Patient Active Problem List   Diagnosis Date Noted  . Diverticulitis of colon 05/08/2016  . Leukocytosis 05/08/2016  . Diverticulitis, colon 05/08/2016  . Acute diverticulitis 04/19/2016  . NASH (nonalcoholic steatohepatitis) with elevated LFTs 04/19/2016  . Tobacco abuse disorder 04/19/2016  . Hypokalemia 04/19/2016  . Hypertension 04/19/2016  . Periorbital edema of left eye 04/19/2016  . Hypomagnesemia 04/19/2016  . Hypophosphatemia 04/19/2016  . Bulging disc   . Fatty liver     Past Surgical History:  Procedure Laterality Date  . ABDOMINAL HYSTERECTOMY    . ANKLE SURGERY    . APPENDECTOMY    . CESAREAN SECTION      OB History    No data available       Home Medications    Prior to Admission medications   Medication Sig Start Date End Date Taking? Authorizing Provider  albuterol (PROVENTIL HFA;VENTOLIN HFA) 108 (90 BASE) MCG/ACT inhaler Inhale 1-2 puffs into the lungs every 6 (six) hours as needed for wheezing or shortness of breath.   Yes Historical Provider, MD  losartan (COZAAR) 25 MG tablet Take 25 mg by mouth daily.   Yes Historical Provider, MD  naproxen sodium (ANAPROX) 220 MG tablet Take 440 mg by mouth 2 (two) times daily as needed (for pain.).    Yes  Historical Provider, MD  amoxicillin-clavulanate (AUGMENTIN) 875-125 MG tablet Take 1 tablet by mouth every 12 (twelve) hours. Patient not taking: Reported on 04/04/2017 05/12/16   Nonie Hoyer, PA-C  diphenhydrAMINE (BENADRYL) 50 MG tablet Take 0.5 tablets (25 mg total) by mouth every 8 (eight) hours. Patient not taking: Reported on 04/04/2017 04/22/16   Calvert Cantor, MD  gabapentin (NEURONTIN) 300 MG capsule Take 1 capsule (300 mg total) by mouth at bedtime. 04/04/17   Jacalyn Lefevre, MD  loperamide (IMODIUM A-D) 2 MG tablet Take 1 tablet (2 mg total) by mouth 4 (four) times daily as needed for diarrhea or loose stools. Patient not taking: Reported on 04/04/2017 04/22/16   Calvert Cantor, MD  saccharomyces boulardii (FLORASTOR) 250 MG capsule Take 1 capsule (250 mg total) by mouth 2 (two) times daily. Patient not taking: Reported on 04/04/2017 05/12/16   Nonie Hoyer, PA-C    Family History Family History  Problem Relation Age of Onset  . Deep vein thrombosis Mother   . Cancer - Other Sister     Social History Social History  Substance Use Topics  . Smoking status: Current Every Day Smoker    Packs/day: 0.50    Years: 20.00  . Smokeless tobacco: Never Used  . Alcohol use  Yes     Comment: 4 or 5 drinks per day with soda and ice -04/19/16     Allergies   Aspirin and Lisinopril   Review of Systems Review of Systems  Musculoskeletal:       Right great toe pain  All other systems reviewed and are negative.    Physical Exam Updated Vital Signs BP (!) 152/101 (BP Location: Left Arm)   Pulse 80   Temp 98.4 F (36.9 C) (Oral)   Resp 19   SpO2 99%   Physical Exam  Constitutional: She is oriented to person, place, and time. She appears well-developed and well-nourished.  HENT:  Head: Normocephalic and atraumatic.  Right Ear: External ear normal.  Left Ear: External ear normal.  Nose: Nose normal.  Mouth/Throat: Oropharynx is clear and moist.  Eyes: Conjunctivae and EOM are normal.  Pupils are equal, round, and reactive to light.  Neck: Normal range of motion. Neck supple.  Cardiovascular: Normal rate, regular rhythm, normal heart sounds and intact distal pulses.   Pulmonary/Chest: Effort normal and breath sounds normal.  Abdominal: Soft. Bowel sounds are normal.  Musculoskeletal:  Right great toe:  Mild lateral tenderness.  No redness or swelling.  Neurological: She is alert and oriented to person, place, and time.  Skin: Skin is warm and dry.  Psychiatric: She has a normal mood and affect. Her behavior is normal. Judgment and thought content normal.  Nursing note and vitals reviewed.    ED Treatments / Results  Labs (all labs ordered are listed, but only abnormal results are displayed) Labs Reviewed  CBG MONITORING, ED - Abnormal; Notable for the following:       Result Value   Glucose-Capillary 108 (*)    All other components within normal limits    EKG  EKG Interpretation None       Radiology Dg Toe Great Right  Result Date: 04/04/2017 CLINICAL DATA:  A week of right great toe pain with increased severity last night. No report of injury. EXAM: RIGHT GREAT TOE COMPARISON:  None in PACs FINDINGS: The bones are subjectively adequately mineralized. There is no lytic or blastic lesion. No erosive changes are observed. No acute or healing fracture is demonstrated. The interphalangeal and the MTP joint are normal in appearance. No first metatarsal abnormality is observed. The soft tissues of the great toe are normal. IMPRESSION: There is no acute or significant chronic bony abnormality of the right great toe. Electronically Signed   By: David  [place] M.D.   On: 04/04/2017 07:32    Procedures Procedures (including critical care time)  Medications Ordered in ED Medications - No data to display   Initial Impression / Assessment and Plan / ED Course  I have reviewed the triage vital signs and the nursing notes.  Pertinent labs & imaging results that were  available during my care of the patient were reviewed by me and considered in my medical decision making (see chart for details).     Pt's sx are consistent with neuropathic pain.  She describes burning in her feet and feeling like she is walking on gravel.  She will be started on neurontin and instructed to f/u with pcp.  Final Clinical Impressions(s) / ED Diagnoses   Final diagnoses:  Toe pain, right    New Prescriptions New Prescriptions   GABAPENTIN (NEURONTIN) 300 MG CAPSULE    Take 1 capsule (300 mg total) by mouth at bedtime.     Jacalyn Lefevre, MD 04/04/17 [phone]

## 2017-04-04 NOTE — ED Triage Notes (Signed)
Pt states that her tow is red, warm, an swollen

## 2017-04-04 NOTE — ED Triage Notes (Signed)
Pt complains of right bog toe pain for one week, the pain increased last night No injury noted

## 2017-04-09 ENCOUNTER — Ambulatory Visit (INDEPENDENT_AMBULATORY_CARE_PROVIDER_SITE_OTHER): Payer: Medicaid Other | Admitting: Orthopaedic Surgery

## 2017-04-09 ENCOUNTER — Encounter (INDEPENDENT_AMBULATORY_CARE_PROVIDER_SITE_OTHER): Payer: Self-pay | Admitting: Orthopaedic Surgery

## 2017-04-09 DIAGNOSIS — M79674 Pain in right toe(s): Secondary | ICD-10-CM | POA: Diagnosis not present

## 2017-04-09 NOTE — Progress Notes (Signed)
Office Visit Note   Patient: Candice Roy           Date of Birth: 1956-05-17           MRN: [id number] Visit Date: 04/09/2017              Requested by: [facility] PonderLlc, [facility] [address] [place], [place] [id number] PCP: System, Pcp Not In   Assessment & Plan: Visit Diagnoses:  1. Great toe pain, right     Plan: X-rays were negative. I think she may have irritated a sensory nerve in her toe. I encouraged her knee with some more time. Treat this symptomatically. I'll see her back as needed.  Follow-Up Instructions: Return if symptoms worsen or fail to improve.   Orders:  No orders of the defined types were placed in this encounter.  No orders of the defined types were placed in this encounter.     Procedures: No procedures performed   Clinical Data: No additional findings.   Subjective: Chief Complaint  Patient presents with  . Right Great Toe - Pain    Patient is a 61 year old female who is in with right great toe burning for about a week. She states she wore some heel prior to the burning pain but she denies any injuries. She has not taken any medicines other gabapentin. She went to the ER. The x-rays were normal. Denies constitutional symptoms    Review of Systems  Constitutional: Negative.   HENT: Negative.   Eyes: Negative.   Respiratory: Negative.   Cardiovascular: Negative.   Endocrine: Negative.   Musculoskeletal: Negative.   Neurological: Negative.   Hematological: Negative.   Psychiatric/Behavioral: Negative.   All other systems reviewed and are negative.    Objective: Vital Signs: There were no vitals taken for this visit.  Physical Exam  Constitutional: She is oriented to person, place, and time. She appears well-developed and well-nourished.  HENT:  Head: Normocephalic and atraumatic.  Eyes: EOM are normal.  Neck: Neck supple.  Pulmonary/Chest: Effort normal.  Abdominal: Soft.  Neurological: She is alert and oriented  to person, place, and time.  Skin: Skin is warm. Capillary refill takes less than 2 seconds.  Psychiatric: She has a normal mood and affect. Her behavior is normal. Judgment and thought content normal.  Nursing note and vitals reviewed.   Ortho Exam Right great toe exam shows no swelling or skin changes her any signs of infection. There is painless range of motion of the MTP joint. The sesamoids are nontender. No evidence of hallux valgus. Specialty Comments:  No specialty comments available.  Imaging: No results found.   PMFS History: Patient Active Problem List   Diagnosis Date Noted  . Diverticulitis of colon 05/08/2016  . Leukocytosis 05/08/2016  . Diverticulitis, colon 05/08/2016  . Acute diverticulitis 04/19/2016  . NASH (nonalcoholic steatohepatitis) with elevated LFTs 04/19/2016  . Tobacco abuse disorder 04/19/2016  . Hypokalemia 04/19/2016  . Hypertension 04/19/2016  . Periorbital edema of left eye 04/19/2016  . Hypomagnesemia 04/19/2016  . Hypophosphatemia 04/19/2016  . Bulging disc   . Fatty liver    Past Medical History:  Diagnosis Date  . Bulging disc   . Diverticulitis   . Fatty liver   . Hypertension     Family History  Problem Relation Age of Onset  . Deep vein thrombosis Mother   . Cancer - Other Sister     Past Surgical History:  Procedure Laterality Date  . ABDOMINAL  HYSTERECTOMY    . ANKLE SURGERY    . APPENDECTOMY    . CESAREAN SECTION     Social History   Occupational History  . Not on file.   Social History Main Topics  . Smoking status: Former Smoker    Packs/day: 0.50    Years: 20.00    Quit date: 04/09/2017  . Smokeless tobacco: Never Used     Comment: pt states she does not smoke  . Alcohol use Yes     Comment: 4 or 5 drinks per day with soda and ice -04/19/16  . Drug use: No  . Sexual activity: Not on file

## 2017-07-27 ENCOUNTER — Emergency Department (HOSPITAL_COMMUNITY): Payer: Medicare Other

## 2017-07-27 ENCOUNTER — Encounter (HOSPITAL_COMMUNITY): Payer: Self-pay | Admitting: Emergency Medicine

## 2017-07-27 DIAGNOSIS — K219 Gastro-esophageal reflux disease without esophagitis: Secondary | ICD-10-CM | POA: Diagnosis present

## 2017-07-27 DIAGNOSIS — D696 Thrombocytopenia, unspecified: Secondary | ICD-10-CM | POA: Diagnosis present

## 2017-07-27 DIAGNOSIS — J471 Bronchiectasis with (acute) exacerbation: Secondary | ICD-10-CM | POA: Diagnosis present

## 2017-07-27 DIAGNOSIS — T380X5A Adverse effect of glucocorticoids and synthetic analogues, initial encounter: Secondary | ICD-10-CM | POA: Diagnosis present

## 2017-07-27 DIAGNOSIS — R7303 Prediabetes: Secondary | ICD-10-CM | POA: Diagnosis present

## 2017-07-27 DIAGNOSIS — M549 Dorsalgia, unspecified: Secondary | ICD-10-CM | POA: Diagnosis present

## 2017-07-27 DIAGNOSIS — E669 Obesity, unspecified: Secondary | ICD-10-CM | POA: Diagnosis present

## 2017-07-27 DIAGNOSIS — J9601 Acute respiratory failure with hypoxia: Principal | ICD-10-CM | POA: Diagnosis present

## 2017-07-27 DIAGNOSIS — R0602 Shortness of breath: Secondary | ICD-10-CM | POA: Diagnosis not present

## 2017-07-27 DIAGNOSIS — Z87891 Personal history of nicotine dependence: Secondary | ICD-10-CM

## 2017-07-27 DIAGNOSIS — R739 Hyperglycemia, unspecified: Secondary | ICD-10-CM | POA: Diagnosis present

## 2017-07-27 DIAGNOSIS — R911 Solitary pulmonary nodule: Secondary | ICD-10-CM | POA: Diagnosis present

## 2017-07-27 DIAGNOSIS — I10 Essential (primary) hypertension: Secondary | ICD-10-CM | POA: Diagnosis present

## 2017-07-27 DIAGNOSIS — K7581 Nonalcoholic steatohepatitis (NASH): Secondary | ICD-10-CM | POA: Diagnosis present

## 2017-07-27 DIAGNOSIS — Z6833 Body mass index (BMI) 33.0-33.9, adult: Secondary | ICD-10-CM

## 2017-07-27 LAB — BASIC METABOLIC PANEL
ANION GAP: 9 (ref 5–15)
BUN: 9 mg/dL (ref 6–20)
CALCIUM: 9.6 mg/dL (ref 8.9–10.3)
CO2: 27 mmol/L (ref 22–32)
Chloride: 103 mmol/L (ref 101–111)
Creatinine, Ser: 0.89 mg/dL (ref 0.44–1.00)
GFR calc Af Amer: >60 mL/min (ref >60)
GLUCOSE: 207 mg/dL — AB (ref 65–99)
Potassium: 3.9 mmol/L (ref 3.5–5.1)
SODIUM: 139 mmol/L (ref 135–145)

## 2017-07-27 LAB — CBC
HCT: 38.7 % (ref 36.0–46.0)
HEMOGLOBIN: 13 g/dL (ref 12.0–15.0)
MCH: 29.2 pg (ref 26.0–34.0)
MCHC: 33.6 g/dL (ref 30.0–36.0)
MCV: 87 fL (ref 78.0–100.0)
PLATELETS: 132 10*3/uL — AB (ref 150–400)
RBC: 4.45 MIL/uL (ref 3.87–5.11)
RDW: 15.8 % — AB (ref 11.5–15.5)
WBC: 9.5 10*3/uL (ref 4.0–10.5)

## 2017-07-27 MED ORDER — ALBUTEROL SULFATE (2.5 MG/3ML) 0.083% IN NEBU
INHALATION_SOLUTION | RESPIRATORY_TRACT | Status: AC
  Filled 2017-07-27: qty 6

## 2017-07-27 NOTE — ED Triage Notes (Addendum)
Pt reports SHOB, expiratory wheezing in triage ongoing since last night. Hypertensive in triage. Diaphoretic, labored breathing, room air sats 89% initially. Up to 96% on breathing treatment. Reports cough with production. Non complaint with meds for 5 months.

## 2017-07-28 ENCOUNTER — Inpatient Hospital Stay (HOSPITAL_COMMUNITY)
Admission: EM | Admit: 2017-07-28 | Discharge: 2017-07-31 | DRG: 189 | Disposition: A | Discharge status: Home / Self Care | Payer: Medicare Other | Attending: Internal Medicine | Admitting: Internal Medicine

## 2017-07-28 ENCOUNTER — Encounter (HOSPITAL_COMMUNITY): Payer: Self-pay | Admitting: Physician Assistant

## 2017-07-28 ENCOUNTER — Observation Stay (HOSPITAL_BASED_OUTPATIENT_CLINIC_OR_DEPARTMENT_OTHER): Payer: Medicare Other

## 2017-07-28 ENCOUNTER — Observation Stay (HOSPITAL_COMMUNITY): Payer: Medicare Other

## 2017-07-28 DIAGNOSIS — M7989 Other specified soft tissue disorders: Secondary | ICD-10-CM

## 2017-07-28 DIAGNOSIS — M549 Dorsalgia, unspecified: Secondary | ICD-10-CM

## 2017-07-28 DIAGNOSIS — R0602 Shortness of breath: Secondary | ICD-10-CM | POA: Diagnosis not present

## 2017-07-28 DIAGNOSIS — R739 Hyperglycemia, unspecified: Secondary | ICD-10-CM

## 2017-07-28 DIAGNOSIS — D696 Thrombocytopenia, unspecified: Secondary | ICD-10-CM

## 2017-07-28 DIAGNOSIS — J441 Chronic obstructive pulmonary disease with (acute) exacerbation: Secondary | ICD-10-CM

## 2017-07-28 DIAGNOSIS — I10 Essential (primary) hypertension: Secondary | ICD-10-CM | POA: Diagnosis present

## 2017-07-28 DIAGNOSIS — G8929 Other chronic pain: Secondary | ICD-10-CM | POA: Diagnosis present

## 2017-07-28 DIAGNOSIS — K7581 Nonalcoholic steatohepatitis (NASH): Secondary | ICD-10-CM | POA: Diagnosis present

## 2017-07-28 DIAGNOSIS — R0603 Acute respiratory distress: Secondary | ICD-10-CM

## 2017-07-28 DIAGNOSIS — K5732 Diverticulitis of large intestine without perforation or abscess without bleeding: Secondary | ICD-10-CM | POA: Diagnosis present

## 2017-07-28 DIAGNOSIS — J9601 Acute respiratory failure with hypoxia: Secondary | ICD-10-CM | POA: Diagnosis present

## 2017-07-28 LAB — I-STAT ARTERIAL BLOOD GAS, ED
ACID-BASE DEFICIT: 1 mmol/L (ref 0.0–2.0)
BICARBONATE: 23.9 mmol/L (ref 20.0–28.0)
O2 SAT: 87 %
PH ART: 7.388 (ref 7.350–7.450)
PO2 ART: 53 mmHg — AB (ref 83.0–108.0)
Patient temperature: 98.6
TCO2: 25 mmol/L (ref 22–32)
pCO2 arterial: 39.6 mmHg (ref 32.0–48.0)

## 2017-07-28 LAB — URINALYSIS, ROUTINE W REFLEX MICROSCOPIC
BILIRUBIN URINE: NEGATIVE
Bacteria, UA: NONE SEEN
Glucose, UA: >=500 mg/dL — AB
HGB URINE DIPSTICK: NEGATIVE
KETONES UR: NEGATIVE mg/dL
LEUKOCYTES UA: NEGATIVE
NITRITE: NEGATIVE
PH: 5 (ref 5.0–8.0)
Protein, ur: NEGATIVE mg/dL
SPECIFIC GRAVITY, URINE: 1.031 — AB (ref 1.005–1.030)
WBC UA: NONE SEEN WBC/hpf (ref 0–5)

## 2017-07-28 LAB — GLUCOSE, CAPILLARY
GLUCOSE-CAPILLARY: 210 mg/dL — AB (ref 65–99)
GLUCOSE-CAPILLARY: 328 mg/dL — AB (ref 65–99)
Glucose-Capillary: 182 mg/dL — ABNORMAL HIGH (ref 65–99)

## 2017-07-28 LAB — HEMOGLOBIN A1C
HEMOGLOBIN A1C: 6.2 % — AB (ref 4.8–5.6)
Mean Plasma Glucose: 131.24 mg/dL

## 2017-07-28 LAB — ETHANOL: Alcohol, Ethyl (B): <5 mg/dL (ref <5)

## 2017-07-28 LAB — HIV ANTIBODY (ROUTINE TESTING W REFLEX): HIV Screen 4th Generation wRfx: NONREACTIVE

## 2017-07-28 MED ORDER — ONDANSETRON HCL 4 MG PO TABS: 4.0000 mg | ORAL_TABLET | Freq: Four times a day (QID) | ORAL | Status: DC | PRN

## 2017-07-28 MED ORDER — ACETAMINOPHEN 650 MG RE SUPP: 650.0000 mg | Freq: Four times a day (QID) | RECTAL | Status: DC | PRN

## 2017-07-28 MED ORDER — ACETAMINOPHEN 325 MG PO TABS
650.0000 mg | ORAL_TABLET | Freq: Four times a day (QID) | ORAL | Status: DC | PRN
  Administered 2017-07-29: 650 mg via ORAL
  Filled 2017-07-28 (×2): qty 2

## 2017-07-28 MED ORDER — ALBUTEROL (5 MG/ML) CONTINUOUS INHALATION SOLN
5.0000 mg/h | INHALATION_SOLUTION | RESPIRATORY_TRACT | Status: DC
  Administered 2017-07-28: 5 mg/h via RESPIRATORY_TRACT
  Filled 2017-07-28: qty 40

## 2017-07-28 MED ORDER — ONDANSETRON HCL 4 MG/2ML IJ SOLN: 4.0000 mg | Freq: Four times a day (QID) | INTRAMUSCULAR | Status: DC | PRN

## 2017-07-28 MED ORDER — ENOXAPARIN SODIUM 40 MG/0.4ML ~~LOC~~ SOLN
40.0000 mg | SUBCUTANEOUS | Status: DC
  Administered 2017-07-28 – 2017-07-30 (×3): 40 mg via SUBCUTANEOUS
  Filled 2017-07-28 (×3): qty 0.4

## 2017-07-28 MED ORDER — IPRATROPIUM-ALBUTEROL 0.5-2.5 (3) MG/3ML IN SOLN
3.0000 mL | Freq: Once | RESPIRATORY_TRACT | Status: AC
  Administered 2017-07-28: 3 mL via RESPIRATORY_TRACT
  Filled 2017-07-28: qty 3

## 2017-07-28 MED ORDER — INSULIN ASPART 100 UNIT/ML ~~LOC~~ SOLN
0.0000 [IU] | Freq: Three times a day (TID) | SUBCUTANEOUS | Status: DC
  Administered 2017-07-28: 7 [IU] via SUBCUTANEOUS
  Administered 2017-07-28: 3 [IU] via SUBCUTANEOUS
  Administered 2017-07-29: 7 [IU] via SUBCUTANEOUS
  Administered 2017-07-29: 5 [IU] via SUBCUTANEOUS
  Administered 2017-07-29: 2 [IU] via SUBCUTANEOUS
  Administered 2017-07-30 (×2): 5 [IU] via SUBCUTANEOUS
  Administered 2017-07-30 – 2017-07-31 (×2): 3 [IU] via SUBCUTANEOUS

## 2017-07-28 MED ORDER — MAGNESIUM SULFATE 2 GM/50ML IV SOLN
2.0000 g | Freq: Once | INTRAVENOUS | Status: AC
  Administered 2017-07-28: 2 g via INTRAVENOUS
  Filled 2017-07-28: qty 50

## 2017-07-28 MED ORDER — GUAIFENESIN ER 600 MG PO TB12
600.0000 mg | ORAL_TABLET | Freq: Two times a day (BID) | ORAL | Status: DC | PRN
  Administered 2017-07-29: 600 mg via ORAL
  Filled 2017-07-28: qty 1

## 2017-07-28 MED ORDER — SENNOSIDES-DOCUSATE SODIUM 8.6-50 MG PO TABS: 1.0000 | ORAL_TABLET | Freq: Every evening | ORAL | Status: DC | PRN

## 2017-07-28 MED ORDER — AZITHROMYCIN 250 MG PO TABS
500.0000 mg | ORAL_TABLET | Freq: Once | ORAL | Status: AC
  Administered 2017-07-28: 500 mg via ORAL
  Filled 2017-07-28: qty 2

## 2017-07-28 MED ORDER — PANTOPRAZOLE SODIUM 40 MG PO TBEC
40.0000 mg | DELAYED_RELEASE_TABLET | Freq: Every day | ORAL | Status: DC
  Administered 2017-07-28 – 2017-07-31 (×4): 40 mg via ORAL
  Filled 2017-07-28 (×4): qty 1

## 2017-07-28 MED ORDER — METHYLPREDNISOLONE SODIUM SUCC 125 MG IJ SOLR
125.0000 mg | Freq: Once | INTRAMUSCULAR | Status: AC
  Administered 2017-07-28: 125 mg via INTRAVENOUS
  Filled 2017-07-28: qty 2

## 2017-07-28 MED ORDER — IOPAMIDOL (ISOVUE-370) INJECTION 76%
INTRAVENOUS | Status: AC
  Administered 2017-07-28: 100 mL
  Filled 2017-07-28: qty 100

## 2017-07-28 MED ORDER — HYDRALAZINE HCL 20 MG/ML IJ SOLN: 5.0000 mg | Freq: Three times a day (TID) | INTRAMUSCULAR | Status: DC | PRN

## 2017-07-28 MED ORDER — BISACODYL 10 MG RE SUPP: 10.0000 mg | Freq: Every day | RECTAL | Status: DC | PRN

## 2017-07-28 MED ORDER — IPRATROPIUM-ALBUTEROL 0.5-2.5 (3) MG/3ML IN SOLN
3.0000 mL | RESPIRATORY_TRACT | Status: DC
  Administered 2017-07-28 – 2017-07-30 (×13): 3 mL via RESPIRATORY_TRACT
  Filled 2017-07-28 (×15): qty 3

## 2017-07-28 MED ORDER — PANTOPRAZOLE SODIUM 40 MG IV SOLR
40.0000 mg | INTRAVENOUS | Status: DC
  Filled 2017-07-28: qty 40

## 2017-07-28 MED ORDER — METHYLPREDNISOLONE SODIUM SUCC 125 MG IJ SOLR
60.0000 mg | Freq: Four times a day (QID) | INTRAMUSCULAR | Status: AC
  Administered 2017-07-28 – 2017-07-29 (×4): 60 mg via INTRAVENOUS
  Filled 2017-07-28 (×4): qty 2

## 2017-07-28 NOTE — ED Notes (Addendum)
Patient's family member concerned about patient's breathing.  This RN reassessed.  Rhonchi noted, patient's breathing labored.  Vitals reassessed and acuity increased.  Will prioritize rooming.

## 2017-07-28 NOTE — ED Provider Notes (Signed)
[facility]-EMERGENCY DEPT Provider Note   CSN: [id number] Arrival date & time: 07/27/17  2159     History   Chief Complaint Chief Complaint  Patient presents with  . Shortness of Breath    HPI Candice Roy is a 61 y.o. female.  HPI  Patient is a 61 year old female presenting with shortness of breath. Patient has a history of smoking, quit less than a year ago. Patient has inhalers at home but has not been using them. Patient has not been diagnosed COPD previously. Patient reports 2-3 days of increased cough, sputum production and wheezing.no fevers. No recent travel.  Past Medical History:  Diagnosis Date  . Bulging disc   . Diverticulitis   . Fatty liver   . Hypertension     Patient Active Problem List   Diagnosis Date Noted  . Chronic back pain 07/28/2017  . Acute respiratory distress 07/28/2017  . Diverticulitis of colon 05/08/2016  . Leukocytosis 05/08/2016  . Diverticulitis, colon 05/08/2016  . Acute diverticulitis 04/19/2016  . NASH (nonalcoholic steatohepatitis) with elevated LFTs 04/19/2016  . Tobacco abuse disorder 04/19/2016  . Hypokalemia 04/19/2016  . Hypertension 04/19/2016  . Periorbital edema of left eye 04/19/2016  . Hypomagnesemia 04/19/2016  . Hypophosphatemia 04/19/2016  . Bulging disc   . Fatty liver     Past Surgical History:  Procedure Laterality Date  . ABDOMINAL HYSTERECTOMY    . ANKLE SURGERY    . APPENDECTOMY    . CESAREAN SECTION      OB History    No data available       Home Medications    Prior to Admission medications   Medication Sig Start Date End Date Taking? Authorizing Provider  amoxicillin-clavulanate (AUGMENTIN) 875-125 MG tablet Take 1 tablet by mouth every 12 (twelve) hours. Patient not taking: Reported on 07/28/2017 05/12/16   Nonie Hoyer, PA-C  diphenhydrAMINE (BENADRYL) 50 MG tablet Take 0.5 tablets (25 mg total) by mouth every 8 (eight) hours. Patient not taking: Reported on 07/28/2017 04/22/16   Calvert Cantor, MD  gabapentin (NEURONTIN) 300 MG capsule Take 1 capsule (300 mg total) by mouth at bedtime. Patient not taking: Reported on 07/28/2017 04/04/17   Jacalyn Lefevre, MD  loperamide (IMODIUM A-D) 2 MG tablet Take 1 tablet (2 mg total) by mouth 4 (four) times daily as needed for diarrhea or loose stools. Patient not taking: Reported on 07/28/2017 04/22/16   Calvert Cantor, MD  saccharomyces boulardii (FLORASTOR) 250 MG capsule Take 1 capsule (250 mg total) by mouth 2 (two) times daily. Patient not taking: Reported on 07/28/2017 05/12/16   Nonie Hoyer, PA-C    Family History Family History  Problem Relation Age of Onset  . Deep vein thrombosis Mother   . Cancer - Other Sister     Social History Social History  Substance Use Topics  . Smoking status: Former Smoker    Packs/day: 0.50    Years: 20.00    Quit date: 04/09/2017  . Smokeless tobacco: Never Used     Comment: pt states she does not smoke  . Alcohol use Yes     Comment: 4 or 5 drinks per day with soda and ice -04/19/16     Allergies   Aspirin and Lisinopril   Review of Systems Review of Systems  Constitutional: Negative for activity change, fatigue and fever.  Respiratory: Positive for cough and shortness of breath. Negative for chest tightness.   Cardiovascular: Negative for chest pain.  Gastrointestinal: Negative for abdominal  pain.  All other systems reviewed and are negative.    Physical Exam Updated Vital Signs BP (!) 154/77   Pulse 70   Temp 98 F (36.7 C) (Axillary)   Resp 18   SpO2 92%   Physical Exam  Constitutional: She is oriented to person, place, and time. She appears well-developed and well-nourished.  HENT:  Head: Normocephalic and atraumatic.  Eyes: Right eye exhibits no discharge.  Cardiovascular: Normal rate, regular rhythm and normal heart sounds.   No murmur heard. Pulmonary/Chest: She has wheezes. She has no rales.  Mild tachypnea. Diffuse wheezing and rhonchi.  Abdominal: Soft. She  exhibits no distension. There is no tenderness.  Neurological: She is oriented to person, place, and time.  Skin: Skin is warm and dry. She is not diaphoretic.  Psychiatric: She has a normal mood and affect.  Nursing note and vitals reviewed.    ED Treatments / Results  Labs (all labs ordered are listed, but only abnormal results are displayed) Labs Reviewed  CBC - Abnormal; Notable for the following:       Result Value   RDW 15.8 (*)    Platelets 132 (*)    All other components within normal limits  BASIC METABOLIC PANEL - Abnormal; Notable for the following:    Glucose, Bld 207 (*)    All other components within normal limits  BLOOD GAS, ARTERIAL  HIV ANTIBODY (ROUTINE TESTING)  HEMOGLOBIN A1C  ETHANOL    EKG  EKG Interpretation None       Radiology Dg Chest 2 View  Result Date: 07/27/2017 CLINICAL DATA:  Shortness of breath, onset yesterday. EXAM: CHEST  2 VIEW COMPARISON:  01/13/2010 FINDINGS: The cardiomediastinal contours are normal. Moderate bronchial thickening. No consolidation. There is a small right pleural effusion. No pneumothorax. No acute osseous abnormalities are seen. IMPRESSION: Moderate bronchial thickening.  Small right pleural effusion. Electronically Signed   By: Rubye Oaks M.D.   On: 07/27/2017 22:46    Procedures Procedures (including critical care time)  Medications Ordered in ED Medications  albuterol (PROVENTIL) (2.5 MG/3ML) 0.083% nebulizer solution (  Not Given 07/28/17 0355)  albuterol (PROVENTIL,VENTOLIN) solution continuous neb (5 mg/hr Nebulization New Bag/Given 07/28/17 0659)  magnesium sulfate IVPB 2 g 50 mL (2 g Intravenous New Bag/Given 07/28/17 0704)  methylPREDNISolone sodium succinate (SOLU-MEDROL) 125 mg/2 mL injection 60 mg (not administered)  ipratropium-albuterol (DUONEB) 0.5-2.5 (3) MG/3ML nebulizer solution 3 mL (not administered)  hydrALAZINE (APRESOLINE) injection 5-10 mg (not administered)  acetaminophen (TYLENOL)  tablet 650 mg (not administered)    Or  acetaminophen (TYLENOL) suppository 650 mg (not administered)  senna-docusate (Senokot-S) tablet 1 tablet (not administered)  bisacodyl (DULCOLAX) suppository 10 mg (not administered)  ondansetron (ZOFRAN) tablet 4 mg (not administered)    Or  ondansetron (ZOFRAN) injection 4 mg (not administered)  enoxaparin (LOVENOX) injection 40 mg (not administered)  ipratropium-albuterol (DUONEB) 0.5-2.5 (3) MG/3ML nebulizer solution 3 mL (3 mLs Nebulization Given 07/28/17 0401)  methylPREDNISolone sodium succinate (SOLU-MEDROL) 125 mg/2 mL injection 125 mg (125 mg Intravenous Given 07/28/17 0505)  azithromycin (ZITHROMAX) tablet 500 mg (500 mg Oral Given 07/28/17 0505)     Initial Impression / Assessment and Plan / ED Course  I have reviewed the triage vital signs and the nursing notes.  Pertinent labs & imaging results that were available during my care of the patient were reviewed by me and considered in my medical decision making (see chart for details).    Patient's well-appearing 61 year old female presenting  with wheezing rhonchi.No PE risk factors. Xray negative except for R pleural effsuion.  Patient improved at triage after getting a DuoNeb. We will achieve patient as if she is having COPD exacerbation. We'll give her Azithro, steroids, dou nebs.  7:36 AM Patient got a duo neb at triage, one here in ED and she still has wheezing and rhonhi.  Will start continuous albuterol and admit patient given degree of wheezing.   CRITICAL CARE Performed by: Arlana Hove Total critical care time: 60 minutes Critical care time was exclusive of separately billable procedures and treating other patients. Critical care was necessary to treat or prevent imminent or life-threatening deterioration. Critical care was time spent personally by me on the following activities: development of treatment plan with patient and/or surrogate as well as nursing, discussions  with consultants, evaluation of patient's response to treatment, examination of patient, obtaining history from patient or surrogate, ordering and performing treatments and interventions, ordering and review of laboratory studies, ordering and review of radiographic studies, pulse oximetry and re-evaluation of patient's condition.  Final Clinical Impressions(s) / ED Diagnoses   Final diagnoses:  Shortness of breath    New Prescriptions New Prescriptions   No medications on file     Abelino Derrick, MD 07/28/17 [phone]

## 2017-07-28 NOTE — ED Notes (Signed)
Pt placed on 2L of O2 via nasal cannula. 

## 2017-07-28 NOTE — Progress Notes (Signed)
CAT finished at this time. Taken off and will obtain ABG in 20 minutes for true result since patient had Aerosol treatment via oxygen.

## 2017-07-28 NOTE — ED Notes (Signed)
Attempted report 

## 2017-07-28 NOTE — Progress Notes (Signed)
VASCULAR LAB PRELIMINARY  PRELIMINARY  PRELIMINARY  PRELIMINARY  Right lower extremity venous duplex completed.    Preliminary report:  There is no obvious evidence of DVT or SVT noted in the right lower extremity.   Ephram Kornegay, RVT 07/28/2017, 9:28 AM

## 2017-07-28 NOTE — H&P (Signed)
History and Physical    Candice Roy HYQ:[id number] DOB: 18-Jun-1956 DOA: 07/28/2017   PCP: System, Pcp Not In   Patient coming from:  Home    Chief Complaint: shortness of breath and wheezing   HPI: Candice Roy is a 61 y.o. female with medical history significant for HTN not on meds,  History fatty liver/ NASH  remote history of smoking, discontinued in January 2018, presenting with one week of increasing shortness of breath with ambulation, no address, with increased cough over the last 2 to 3 days, light green sputum production, and wheezing. The patient denies any fevers. She denies any recent travel. She may have had a sick contact in her knees, who has a "cold". She had been given albuterol inhalers in the past, that she has never used them. She has never been diagnosed with COPD or asthma formally.  Denies any fevers, chest pain, palpitations. She does report right leg increased "discomfort" not discrete pain, or swelling. She reports that her right leg is warmer than usual. She denies any prior DVT or PE. She has not been seen by her PCP in a long time.   ED Course:  BP (!) 154/77   Pulse 70   Temp 98 F (36.7 C) (Axillary)   Resp 18   SpO2 92%  She desated on ambulation to the 80s in RA, doing well in the 90s with 2 L o2   received 2 courses of Duoneb abd Albuterol, received Magnesium, and has been placed on Azithromycin CXR Moderate bronchial thickening.  Small right pleural effusion She continues to wheeze  bicarb 27 white count 9.5 hemoglobin 13  platelets 132 glucose 207 A1C 6.2   Review of Systems:  As per HPI otherwise all other systems reviewed and are negative  Past Medical History:  Diagnosis Date  . Bulging disc   . Diverticulitis   . Fatty liver   . Hypertension     Past Surgical History:  Procedure Laterality Date  . ABDOMINAL HYSTERECTOMY    . ANKLE SURGERY    . APPENDECTOMY    . CESAREAN SECTION      Social History Social History    Social History  . Marital status: Single    Spouse name: N/A  . Number of children: N/A  . Years of education: N/A   Occupational History  . Not on file.   Social History Main Topics  . Smoking status: Former Smoker    Packs/day: 0.50    Years: 20.00    Quit date: 04/09/2017  . Smokeless tobacco: Never Used     Comment: pt states she does not smoke  . Alcohol use Yes     Comment: 4 or 5 drinks per day with soda and ice -04/19/16  . Drug use: No  . Sexual activity: Not on file   Other Topics Concern  . Not on file   Social History Narrative  . No narrative on file     Allergies  Allergen Reactions  . Aspirin Hives    Childhood allergy- can take alleve/ Naproxen  . Lisinopril Swelling    Family History  Problem Relation Age of Onset  . Deep vein thrombosis Mother   . Cancer - Other Sister       Prior to Admission medications   Medication Sig Start Date End Date Taking? Authorizing Provider  amoxicillin-clavulanate (AUGMENTIN) 875-125 MG tablet Take 1 tablet by mouth every 12 (twelve) hours. Patient not taking: Reported on 07/28/2017 05/12/16  Nonie Hoyer, PA-C  diphenhydrAMINE (BENADRYL) 50 MG tablet Take 0.5 tablets (25 mg total) by mouth every 8 (eight) hours. Patient not taking: Reported on 07/28/2017 04/22/16   Calvert Cantor, MD  gabapentin (NEURONTIN) 300 MG capsule Take 1 capsule (300 mg total) by mouth at bedtime. Patient not taking: Reported on 07/28/2017 04/04/17   Jacalyn Lefevre, MD  loperamide (IMODIUM A-D) 2 MG tablet Take 1 tablet (2 mg total) by mouth 4 (four) times daily as needed for diarrhea or loose stools. Patient not taking: Reported on 07/28/2017 04/22/16   Calvert Cantor, MD  saccharomyces boulardii (FLORASTOR) 250 MG capsule Take 1 capsule (250 mg total) by mouth 2 (two) times daily. Patient not taking: Reported on 07/28/2017 05/12/16   Nonie Hoyer, PA-C    Physical Exam:  Vitals:   07/28/17 0400 07/28/17 0401 07/28/17 0430 07/28/17 0700   BP: 136/82  (!) 154/77   Pulse: 71  70   Resp:      Temp:      TempSrc:      SpO2: 93% 92% 92% 92%   Constitutional: uncomfortable due to wheezing, but no acute distress  Eyes: PERRL, lids and conjunctivae normal ENMT: Mucous membranes are moist, without exudate or lesions  Neck: normal, supple, no masses, no thyromegaly Respiratory: Bilateral expiratory wheezing and rhonchi no crackles.Mild resp. Effort as she coughs when trying to speak  Cardiovascular: Regular rate and rhythm, no murmurs, rubs or gallops. No extremity edema. 2+ pedal pulses. No carotid bruits. No Homan's  sign Abdomen: Soft, obese  non tender, No hepatosplenomegaly. Bowel sounds positive.  Musculoskeletal: no clubbing / cyanosis. Moves all extremities Skin: no jaundice, No lesions. RLE warmer than left Neurologic: Sensation intact  Strength equal in all extremities Psychiatric:   Alert and oriented x 3. Normal mood.     Labs on Admission: I have personally reviewed following labs and imaging studies  CBC:  Recent Labs Lab 07/27/17 2214  WBC 9.5  HGB 13.0  HCT 38.7  MCV 87.0  PLT 132*    Basic Metabolic Panel:  Recent Labs Lab 07/27/17 2214  NA 139  K 3.9  CL 103  CO2 27  GLUCOSE 207*  BUN 9  CREATININE 0.89  CALCIUM 9.6    GFR: CrCl cannot be calculated (Unknown ideal weight.).  Liver Function Tests: No results for input(s): AST, ALT, ALKPHOS, BILITOT, PROT, ALBUMIN in the last 168 hours. No results for input(s): LIPASE, AMYLASE in the last 168 hours. No results for input(s): AMMONIA in the last 168 hours.  Coagulation Profile: No results for input(s): INR, PROTIME in the last 168 hours.  Cardiac Enzymes: No results for input(s): CKTOTAL, CKMB, CKMBINDEX, TROPONINI in the last 168 hours.  BNP (last 3 results) No results for input(s): PROBNP in the last 8760 hours.  HbA1C: No results for input(s): HGBA1C in the last 72 hours.  CBG: No results for input(s): GLUCAP in the last  168 hours.  Lipid Profile: No results for input(s): CHOL, HDL, LDLCALC, TRIG, CHOLHDL, LDLDIRECT in the last 72 hours.  Thyroid Function Tests: No results for input(s): TSH, T4TOTAL, FREET4, T3FREE, THYROIDAB in the last 72 hours.  Anemia Panel: No results for input(s): VITAMINB12, FOLATE, FERRITIN, TIBC, IRON, RETICCTPCT in the last 72 hours.  Urine analysis:    Component Value Date/Time   COLORURINE YELLOW 05/08/2016 0836   APPEARANCEUR CLEAR 05/08/2016 0836   LABSPEC >1.046 (H) 05/08/2016 0836   PHURINE 6.5 05/08/2016 0836   GLUCOSEU NEGATIVE 05/08/2016  0836   HGBUR NEGATIVE 05/08/2016 0836   BILIRUBINUR NEGATIVE 05/08/2016 0836   KETONESUR NEGATIVE 05/08/2016 0836   PROTEINUR NEGATIVE 05/08/2016 0836   UROBILINOGEN 1.0 01/28/2015 0120   NITRITE NEGATIVE 05/08/2016 0836   LEUKOCYTESUR NEGATIVE 05/08/2016 0836    Sepsis Labs: @LABRCNTIP (procalcitonin:4,lacticidven:4) )No results found for this or any previous visit (from the past 240 hour(s)).   Radiological Exams on Admission: Dg Chest 2 View  Result Date: 07/27/2017 CLINICAL DATA:  Shortness of breath, onset yesterday. EXAM: CHEST  2 VIEW COMPARISON:  01/13/2010 FINDINGS: The cardiomediastinal contours are normal. Moderate bronchial thickening. No consolidation. There is a small right pleural effusion. No pneumothorax. No acute osseous abnormalities are seen. IMPRESSION: Moderate bronchial thickening.  Small right pleural effusion. Electronically Signed   By: Rubye Oaks M.D.   On: 07/27/2017 22:46    EKG: Independently reviewed.  Assessment/Plan Active Problems:   Acute respiratory distress   NASH (nonalcoholic steatohepatitis) with elevated LFTs   Hypertension   Diverticulitis, colon   Chronic back pain   Hyperglycemia   Thrombocytopenia (HCC)   Acute respiratory distress with hypoxia, r/o COPD versus other etiology. Patient never diagnosed with COPD or asthma, continues to wheeze despite multiple nebs,  MAg, Steroids at the ED. Received Azithromycin. CXR w/o evidence of acute process.O2 sats 90s not on 2 L  %  med surg Duonebs Q2PRN Solumedrol 60 Q6 x24 hrs with CBC monitoring  Protonix 40 mg IV in view need for steroids Continue Azithromycin daily  CT angio chest r/o PE, with LE ultrasound r/o DVT PFTs and Pulmonary eval as OP   CXR in am   Thrombocytopenia in the setting of  possible infection, NASH/ fatty liver  . Current count 132k. No bleeding issues  No transfusion is indicated at this time Monitor counts closely Hold Lovenox if  platelets drop to less than 50,000  Hyperglycemia, no  Prior dx of DM. Glu 209. Receiving steroids due to above. New A1C is 6.2   [facility]  Diabetes Coordinator   Hypertension BP 154/77   Pulse 70  Patient onto on home meds Add Hydralazine Q6 hours as needed for BP 160/90  Will need to establish PCP and f/u for secondary HTN    DVT prophylaxis: Lovenox  Code Status:   Full   Family Communication:  Discussed with patient Disposition Plan: Expect patient to be discharged to home after condition improves Consults called:    None  Admission status:  Medsurg  Obs    Zo Loudon E, PA-C [facility]   07/28/2017, 8:06 AM

## 2017-07-29 ENCOUNTER — Observation Stay (HOSPITAL_COMMUNITY): Payer: Medicare Other

## 2017-07-29 DIAGNOSIS — R911 Solitary pulmonary nodule: Secondary | ICD-10-CM | POA: Diagnosis present

## 2017-07-29 DIAGNOSIS — M549 Dorsalgia, unspecified: Secondary | ICD-10-CM | POA: Diagnosis present

## 2017-07-29 DIAGNOSIS — K219 Gastro-esophageal reflux disease without esophagitis: Secondary | ICD-10-CM | POA: Diagnosis present

## 2017-07-29 DIAGNOSIS — E669 Obesity, unspecified: Secondary | ICD-10-CM | POA: Diagnosis present

## 2017-07-29 DIAGNOSIS — T380X5A Adverse effect of glucocorticoids and synthetic analogues, initial encounter: Secondary | ICD-10-CM | POA: Diagnosis present

## 2017-07-29 DIAGNOSIS — J9601 Acute respiratory failure with hypoxia: Secondary | ICD-10-CM | POA: Diagnosis present

## 2017-07-29 DIAGNOSIS — J471 Bronchiectasis with (acute) exacerbation: Secondary | ICD-10-CM | POA: Diagnosis present

## 2017-07-29 DIAGNOSIS — I10 Essential (primary) hypertension: Secondary | ICD-10-CM | POA: Diagnosis present

## 2017-07-29 DIAGNOSIS — R739 Hyperglycemia, unspecified: Secondary | ICD-10-CM

## 2017-07-29 DIAGNOSIS — Z6832 Body mass index (BMI) 32.0-32.9, adult: Secondary | ICD-10-CM | POA: Diagnosis not present

## 2017-07-29 DIAGNOSIS — R7303 Prediabetes: Secondary | ICD-10-CM | POA: Diagnosis present

## 2017-07-29 DIAGNOSIS — D696 Thrombocytopenia, unspecified: Secondary | ICD-10-CM | POA: Diagnosis present

## 2017-07-29 DIAGNOSIS — J441 Chronic obstructive pulmonary disease with (acute) exacerbation: Secondary | ICD-10-CM | POA: Diagnosis not present

## 2017-07-29 DIAGNOSIS — K7581 Nonalcoholic steatohepatitis (NASH): Secondary | ICD-10-CM | POA: Diagnosis present

## 2017-07-29 DIAGNOSIS — Z6833 Body mass index (BMI) 33.0-33.9, adult: Secondary | ICD-10-CM | POA: Diagnosis not present

## 2017-07-29 DIAGNOSIS — Z87891 Personal history of nicotine dependence: Secondary | ICD-10-CM | POA: Diagnosis not present

## 2017-07-29 DIAGNOSIS — R0602 Shortness of breath: Secondary | ICD-10-CM | POA: Diagnosis present

## 2017-07-29 LAB — COMPREHENSIVE METABOLIC PANEL
ALBUMIN: 4.2 g/dL (ref 3.5–5.0)
ALT: 30 U/L (ref 14–54)
ANION GAP: 10 (ref 5–15)
AST: 33 U/L (ref 15–41)
Alkaline Phosphatase: 62 U/L (ref 38–126)
BILIRUBIN TOTAL: 0.7 mg/dL (ref 0.3–1.2)
BUN: 10 mg/dL (ref 6–20)
CHLORIDE: 103 mmol/L (ref 101–111)
CO2: 25 mmol/L (ref 22–32)
Calcium: 9.9 mg/dL (ref 8.9–10.3)
Creatinine, Ser: 0.83 mg/dL (ref 0.44–1.00)
GFR calc non Af Amer: >60 mL/min (ref >60)
Glucose, Bld: 199 mg/dL — ABNORMAL HIGH (ref 65–99)
POTASSIUM: 4.1 mmol/L (ref 3.5–5.1)
SODIUM: 138 mmol/L (ref 135–145)
Total Protein: 8.2 g/dL — ABNORMAL HIGH (ref 6.5–8.1)

## 2017-07-29 LAB — HEMOGLOBIN A1C
HEMOGLOBIN A1C: 6.3 % — AB (ref 4.8–5.6)
Mean Plasma Glucose: 134.11 mg/dL

## 2017-07-29 LAB — CBC
HEMATOCRIT: 37.7 % (ref 36.0–46.0)
HEMOGLOBIN: 12.3 g/dL (ref 12.0–15.0)
MCH: 28.6 pg (ref 26.0–34.0)
MCHC: 32.6 g/dL (ref 30.0–36.0)
MCV: 87.7 fL (ref 78.0–100.0)
Platelets: 153 10*3/uL (ref 150–400)
RBC: 4.3 MIL/uL (ref 3.87–5.11)
RDW: 16.4 % — ABNORMAL HIGH (ref 11.5–15.5)
WBC: 12.2 10*3/uL — AB (ref 4.0–10.5)

## 2017-07-29 LAB — GLUCOSE, CAPILLARY
GLUCOSE-CAPILLARY: 255 mg/dL — AB (ref 65–99)
GLUCOSE-CAPILLARY: 224 mg/dL — AB (ref 65–99)
Glucose-Capillary: 198 mg/dL — ABNORMAL HIGH (ref 65–99)
Glucose-Capillary: 303 mg/dL — ABNORMAL HIGH (ref 65–99)

## 2017-07-29 MED ORDER — BUDESONIDE 0.25 MG/2ML IN SUSP
0.2500 mg | Freq: Two times a day (BID) | RESPIRATORY_TRACT | Status: DC
  Administered 2017-07-29 – 2017-07-31 (×4): 0.25 mg via RESPIRATORY_TRACT
  Filled 2017-07-29 (×4): qty 2

## 2017-07-29 MED ORDER — DOXYCYCLINE HYCLATE 100 MG PO TABS
100.0000 mg | ORAL_TABLET | Freq: Two times a day (BID) | ORAL | Status: DC
  Administered 2017-07-29 – 2017-07-31 (×4): 100 mg via ORAL
  Filled 2017-07-29 (×4): qty 1

## 2017-07-29 MED ORDER — METHYLPREDNISOLONE SODIUM SUCC 125 MG IJ SOLR
60.0000 mg | Freq: Three times a day (TID) | INTRAMUSCULAR | Status: AC
  Administered 2017-07-29 – 2017-07-30 (×4): 60 mg via INTRAVENOUS
  Filled 2017-07-29 (×4): qty 2

## 2017-07-29 MED ORDER — WHITE PETROLATUM GEL
Status: AC
Start: 2017-07-29 — End: 2017-07-29
  Administered 2017-07-29: 1
  Filled 2017-07-29: qty 1

## 2017-07-29 MED ORDER — DM-GUAIFENESIN ER 30-600 MG PO TB12
1.0000 | ORAL_TABLET | Freq: Two times a day (BID) | ORAL | Status: DC
  Administered 2017-07-29 – 2017-07-31 (×4): 1 via ORAL
  Filled 2017-07-29 (×4): qty 1

## 2017-07-29 NOTE — Progress Notes (Signed)
[facility] PROGRESS NOTE  Candice Roy FUX:[id number] DOB: 08/10/1956 DOA: 07/28/2017 PCP: System, Pcp Not In  Interim summary and HPI 61 y.o. female with medical history significant for HTN not on meds,  History fatty liver/ NASH  remote history of smoking, discontinued in January 2018, presenting with one week of increasing shortness of breath with ambulation, with increased cough over the last 2 to 3 days, light green sputum production, and wheezing. Found to have presumed COPD exacerbation with bronchiectasis.   Assessment/Plan: 1-acute resp failure with hypoxia -due to presumed COPD exacerbation and bronchiectasis  -will continue IV steroids, nebulizer treatment and start doxycycline (patient reports cough to becoming productive) -will start flutter valve and continue weaning oxygen supplementation as tolerated -outpatient PFT'sa nd pulmonary follow up  2-hyperglycemia -w/o prior hx of diabetes -will check A1C  3-lung nodule: with hx of smoking -will need repeat CT scan in the next 3-6 months -pulmonary service follow up to be arranged at discharge  4-obesity type 1 -Body mass index is 32.63 kg/m. -low calorie diet and exercise discussed with patient.  5-HTN -will continue PRN hydralazine -patient was not on meds at home  6-GERD -will continue PPI  Code Status: Full Family Communication: sister at bedside  Disposition Plan: home when medically stable. Continue steroids, continue antibiotics, nebulizer treatment, mucinex and oxygen supplementation.    Consultants:  None   Procedures:  See below for x-ray reports   Antibiotics:  Doxycycline   HPI/Subjective: Afebrile, no CP, no nausea or vomiting. Patient with positive exp wheezing, using oxygen supplementation, unable to speak in full sentences and SOB with minimal exertion.  Objective: Vitals:   07/29/17 1442 07/29/17 1632  BP: 132/69   Pulse: 92 78  Resp: 18 18  Temp: (!) 97.5 F (36.4 C)    SpO2: 94% 93%    Intake/Output Summary (Last 24 hours) at 07/29/17 1635 Last data filed at 07/29/17 0615  Gross per 24 hour  Intake                0 ml  Output             1850 ml  Net            -1850 ml   Filed Weights   07/28/17 1054 07/29/17 0500  Weight: 84.6 kg (186 lb 8.2 oz) 94.5 kg (208 lb 5.4 oz)    Exam:   General:  SOB, unable to speak in full sentences and with increase tachypnea/work of breathing with minimal exertion. Patient denies CP. Using 3L O2 supplementation.  Cardiovascular: S1 an S2, no rubs, no gallops, no murmurs, no JVD  Respiratory: positive tachypnea, rhonchi and diffuse exp wheezing. No using accessory muscles, but increase work of breathing.   Abdomen: soft, NT, ND, positive BS  Musculoskeletal: no cyanosis, no clubbing   Data Reviewed: Basic Metabolic Panel:  Recent Labs Lab 07/27/17 2214 07/29/17 0324  NA 139 138  K 3.9 4.1  CL 103 103  CO2 27 25  GLUCOSE 207* 199*  BUN 9 10  CREATININE 0.89 0.83  CALCIUM 9.6 9.9   Liver Function Tests:  Recent Labs Lab 07/29/17 0324  AST 33  ALT 30  ALKPHOS 62  BILITOT 0.7  PROT 8.2*  ALBUMIN 4.2   CBC:  Recent Labs Lab 07/27/17 2214 07/29/17 0324  WBC 9.5 12.2*  HGB 13.0 12.3  HCT 38.7 37.7  MCV 87.0 87.7  PLT 132* 153   CBG:  Recent Labs Lab 07/28/17  1203 07/28/17 1648 07/28/17 2157 07/29/17 0845 07/29/17 1434  GLUCAP 210* 328* 182* 198* 303*    No results found for this or any previous visit (from the past 240 hour(s)).   Studies: Dg Chest 2 View  Result Date: 07/29/2017 CLINICAL DATA:  Shortness of breath. EXAM: CHEST  2 VIEW COMPARISON:  Chest CT 07/28/2017.  Chest x-ray 07/27/2017. FINDINGS: The lungs are clear without focal pneumonia, edema, pneumothorax or pleural effusion. Cardiopericardial silhouette is at upper limits of normal for size. The visualized bony structures of the thorax are intact. IMPRESSION: No acute cardiopulmonary findings. 11 mm anterior  right lower lobe pulmonary nodule seen on yesterday's CT scan is not evident by chest x-ray today. Electronically Signed   By: Kennith Center M.D.   On: 07/29/2017 12:27   Dg Chest 2 View  Result Date: 07/27/2017 CLINICAL DATA:  Shortness of breath, onset yesterday. EXAM: CHEST  2 VIEW COMPARISON:  01/13/2010 FINDINGS: The cardiomediastinal contours are normal. Moderate bronchial thickening. No consolidation. There is a small right pleural effusion. No pneumothorax. No acute osseous abnormalities are seen. IMPRESSION: Moderate bronchial thickening.  Small right pleural effusion. Electronically Signed   By: Rubye Oaks M.D.   On: 07/27/2017 22:46   Ct Angio Chest Pe W Or Wo Contrast  Result Date: 07/28/2017 CLINICAL DATA:  Shortness of breath and wheezing for 2 days. EXAM: CT ANGIOGRAPHY CHEST WITH CONTRAST TECHNIQUE: Multidetector CT imaging of the chest was performed using the standard protocol during bolus administration of intravenous contrast. Multiplanar CT image reconstructions and MIPs were obtained to evaluate the vascular anatomy. CONTRAST:  100 mL Isovue 370 COMPARISON:  None. FINDINGS: Cardiovascular: Satisfactory opacification of pulmonary arteries noted, and no pulmonary emboli identified. No evidence of thoracic aortic dissection or aneurysm. Aortic atherosclerosis. Mediastinum/Nodes: No masses or pathologically enlarged lymph nodes identified. Lungs/Pleura: 11 mm noncalcified nodule is seen in the anterolateral right lower lobe on image 98/7. No other pulmonary nodules or masses identified. No evidence of pleural effusion. Upper abdomen: No acute findings. Musculoskeletal: No suspicious bone lesions identified. Review of the MIP images confirms the above findings. IMPRESSION: No evidence of pulmonary embolism. 11 mm indeterminate nodule in anterior right lower lobe. Consider one of the following in 3 months for both low-risk and high-risk individuals: (a) followup chest CT, (b) PET-CT, or  (c) tissue sampling. This recommendation follows the consensus statement: Guidelines for Management of Incidental Pulmonary Nodules Detected on CT Images: From the Fleischner Society 2017; Radiology 2017; 284:228-243. Aortic aneurysm NOS (ICD10-I71.9). Electronically Signed   By: Myles Rosenthal M.D.   On: 07/28/2017 10:26    Scheduled Meds: . budesonide (PULMICORT) nebulizer solution  0.25 mg Nebulization BID  . dextromethorphan-guaiFENesin  1 tablet Oral BID  . enoxaparin (LOVENOX) injection  40 mg Subcutaneous Q24H  . insulin aspart  0-9 Units Subcutaneous TID WC  . ipratropium-albuterol  3 mL Nebulization Q4H  . methylPREDNISolone (SOLU-MEDROL) injection  60 mg Intravenous Q8H  . pantoprazole  40 mg Oral Daily   Continuous Infusions:  Active Problems:   NASH (nonalcoholic steatohepatitis) with elevated LFTs   Hypertension   Chronic back pain   Acute respiratory distress   Hyperglycemia   Thrombocytopenia (HCC)   Acute respiratory failure with hypoxia (HCC)    Time spent: 30 minutes    Vassie Loll  [facility] Pager [phone]. If 7PM-7AM, please contact night-coverage at [email or web address], password [facility] 07/29/2017, 4:35 PM  LOS: 0 days

## 2017-07-29 NOTE — Progress Notes (Signed)
Rt instructed pt on the use of flutter valve. Pt able to demonstrate back good technique. 

## 2017-07-30 DIAGNOSIS — J471 Bronchiectasis with (acute) exacerbation: Secondary | ICD-10-CM

## 2017-07-30 DIAGNOSIS — K7581 Nonalcoholic steatohepatitis (NASH): Secondary | ICD-10-CM

## 2017-07-30 DIAGNOSIS — J441 Chronic obstructive pulmonary disease with (acute) exacerbation: Secondary | ICD-10-CM

## 2017-07-30 DIAGNOSIS — R7303 Prediabetes: Secondary | ICD-10-CM

## 2017-07-30 LAB — BASIC METABOLIC PANEL
ANION GAP: 8 (ref 5–15)
BUN: 17 mg/dL (ref 6–20)
CHLORIDE: 105 mmol/L (ref 101–111)
CO2: 24 mmol/L (ref 22–32)
Calcium: 9.2 mg/dL (ref 8.9–10.3)
Creatinine, Ser: 0.92 mg/dL (ref 0.44–1.00)
GFR calc Af Amer: >60 mL/min (ref >60)
GFR calc non Af Amer: >60 mL/min (ref >60)
GLUCOSE: 229 mg/dL — AB (ref 65–99)
POTASSIUM: 4.6 mmol/L (ref 3.5–5.1)
SODIUM: 137 mmol/L (ref 135–145)

## 2017-07-30 LAB — GLUCOSE, CAPILLARY
GLUCOSE-CAPILLARY: 281 mg/dL — AB (ref 65–99)
GLUCOSE-CAPILLARY: 204 mg/dL — AB (ref 65–99)
Glucose-Capillary: 220 mg/dL — ABNORMAL HIGH (ref 65–99)
Glucose-Capillary: 295 mg/dL — ABNORMAL HIGH (ref 65–99)

## 2017-07-30 MED ORDER — PREDNISONE 50 MG PO TABS
60.0000 mg | ORAL_TABLET | Freq: Every day | ORAL | Status: DC
  Administered 2017-07-31: 60 mg via ORAL
  Filled 2017-07-30: qty 1

## 2017-07-30 MED ORDER — IPRATROPIUM-ALBUTEROL 0.5-2.5 (3) MG/3ML IN SOLN
3.0000 mL | Freq: Four times a day (QID) | RESPIRATORY_TRACT | Status: DC | PRN
  Administered 2017-07-30: 3 mL via RESPIRATORY_TRACT

## 2017-07-30 MED ORDER — INSULIN DETEMIR 100 UNIT/ML ~~LOC~~ SOLN
10.0000 [IU] | Freq: Every day | SUBCUTANEOUS | Status: DC
  Administered 2017-07-30: 10 [IU] via SUBCUTANEOUS
  Filled 2017-07-30 (×2): qty 0.1

## 2017-07-30 MED ORDER — LIVING WELL WITH DIABETES BOOK
Freq: Once | Status: AC
  Administered 2017-07-30: 12:00:00
  Filled 2017-07-30: qty 1

## 2017-07-30 MED ORDER — IPRATROPIUM-ALBUTEROL 0.5-2.5 (3) MG/3ML IN SOLN
3.0000 mL | Freq: Four times a day (QID) | RESPIRATORY_TRACT | Status: DC
  Administered 2017-07-30 – 2017-07-31 (×3): 3 mL via RESPIRATORY_TRACT
  Filled 2017-07-30 (×4): qty 3

## 2017-07-30 NOTE — Progress Notes (Signed)
Results for Candice Roy, Candice Roy (MRN [id number]) as of 07/30/2017 14:28  Ref. Range 07/29/2017 14:34 07/29/2017 17:02 07/29/2017 22:22 07/30/2017 07:47 07/30/2017 12:11  Glucose-Capillary Latest Ref Range: 65 - 99 mg/dL 294 (H) 765 (H) 465 (H) 220 (H) 295 (H)  Noted that CBGs continue to be greater than 180 mg/dl. Recommend increasing Novolog to MODERATE correction scale TID & HS and adding Novolog 5 units TID with meals if patient eats at least 50% of meal. Will continue to monitor blood sugars while in the hospital.  May need low dose basal insulin if blood sugars do not improve.   Smith Mince RN BSN CDE Diabetes Coordinator Pager: [phone]  8am-5pm

## 2017-07-30 NOTE — Progress Notes (Signed)
[facility] PROGRESS NOTE  Candice Roy ZOX:[id number] DOB: 1956/09/27 DOA: 07/28/2017 PCP: System, Pcp Not In  Interim summary and HPI 61 y.o. female with medical history significant for HTN not on meds,  History fatty liver/ NASH  remote history of smoking, discontinued in January 2018, presenting with one week of increasing shortness of breath with ambulation, with increased cough over the last 2 to 3 days, light green sputum production, and wheezing. Found to have presumed COPD exacerbation with bronchiectasis.   Assessment/Plan: 1-acute resp failure with hypoxia -due to presumed COPD exacerbation and bronchiectasis  -We'll initiate steroids tapering, continue nebulizer treatment, continue doxycycline, continue the use of flutter valve and also the use of Mucinex. -Will continue weaning oxygen as tolerated and assess need for supplementation with exertion.  -Patient will require pulmonary function tests as an outpatient and follow-up with the pulmonary service for initiation/adjustment on maintenance therapy. -At discharge planning to use albuterol as a rescue inhaler, Spiriva daily and the use of Symbicort twice a day.  2-hyperglycemia/prediabetes -Hemoglobin A1c 6.3 (patient qualifying for prediabetes) -Low carbohydrate diet has been recommended -We will continue sliding scale insulin and will also add Levemir, as her CBGs continue to be  high due to steroids treatment. -Will require close follow-up as an outpatient and most likely initiation of hypoglycemic regimen in the near future.  3-lung nodule: with hx of smoking -will need repeat CT scan in the next 3-6 months -pulmonary service follow up to be arranged at discharge  4-obesity type 1 -Body mass index is 33.25 kg/m. -low calorie diet and exercise discussed with patient.  5-HTN -will continue PRN hydralazine -patient was not on meds at home -Advised to follow a heart healthy diet.  6-GERD -will continue  PPI  Code Status: Full Family Communication: sister at bedside  Disposition Plan: home when medically stable. Continue steroids, continue antibiotics, nebulizer treatment, mucinex and oxygen supplementation.    Consultants:  None   Procedures:  See below for x-ray reports   Antibiotics:  Doxycycline   HPI/Subjective: Afebrile, no CP, still SOB, but improved. Using 2L [place] supplementation.  Objective: Vitals:   07/30/17 1234 07/30/17 1404  BP:  (!) 148/81  Pulse:  93  Resp:  18  Temp:  98.4 F (36.9 C)  SpO2: 97% 98%    Intake/Output Summary (Last 24 hours) at 07/30/17 1748 Last data filed at 07/30/17 1404  Gross per 24 hour  Intake              720 ml  Output                0 ml  Net              720 ml   Filed Weights   07/28/17 1054 07/29/17 0500 07/30/17 0601  Weight: 84.6 kg (186 lb 8.2 oz) 94.5 kg (208 lb 5.4 oz) 96.3 kg (212 lb 4.9 oz)    Exam:   General:  Patient reports improvement in her breathing; still using 2 L oxygen supplementation and finding difficult to speak in full sentences (even though she is doing much better in comparison then just today). Denies chest pain.  Cardiovascular: S1 and S2, no rubs, no gallops, no murmurs, no JVD.   Respiratory: Positive tachypnea (even improved in comparison to yesterday), no using accessory muscles.  Abdomen: soft, NT, ND, positive BS  Musculoskeletal: no cyanosis, no clubbing, no edema.   Data Reviewed: Basic Metabolic Panel:  Recent Labs Lab 07/27/17 2214  07/29/17 0324 07/30/17 0300  NA 139 138 137  K 3.9 4.1 4.6  CL 103 103 105  CO2 27 25 24   GLUCOSE 207* 199* 229*  BUN 9 10 17   CREATININE 0.89 0.83 0.92  CALCIUM 9.6 9.9 9.2   Liver Function Tests:  Recent Labs Lab 07/29/17 0324  AST 33  ALT 30  ALKPHOS 62  BILITOT 0.7  PROT 8.2*  ALBUMIN 4.2   CBC:  Recent Labs Lab 07/27/17 2214 07/29/17 0324  WBC 9.5 12.2*  HGB 13.0 12.3  HCT 38.7 37.7  MCV 87.0 87.7  PLT 132* 153    CBG:  Recent Labs Lab 07/29/17 1702 07/29/17 2222 07/30/17 0747 07/30/17 1211 07/30/17 1715  GLUCAP 255* 224* 220* 295* 281*    No results found for this or any previous visit (from the past 240 hour(s)).   Studies: Dg Chest 2 View  Result Date: 07/29/2017 CLINICAL DATA:  Shortness of breath. EXAM: CHEST  2 VIEW COMPARISON:  Chest CT 07/28/2017.  Chest x-ray 07/27/2017. FINDINGS: The lungs are clear without focal pneumonia, edema, pneumothorax or pleural effusion. Cardiopericardial silhouette is at upper limits of normal for size. The visualized bony structures of the thorax are intact. IMPRESSION: No acute cardiopulmonary findings. 11 mm anterior right lower lobe pulmonary nodule seen on yesterday's CT scan is not evident by chest x-ray today. Electronically Signed   By: Kennith Center M.D.   On: 07/29/2017 12:27    Scheduled Meds: . budesonide (PULMICORT) nebulizer solution  0.25 mg Nebulization BID  . dextromethorphan-guaiFENesin  1 tablet Oral BID  . doxycycline  100 mg Oral Q12H  . enoxaparin (LOVENOX) injection  40 mg Subcutaneous Q24H  . insulin aspart  0-9 Units Subcutaneous TID WC  . insulin detemir  10 Units Subcutaneous QHS  . ipratropium-albuterol  3 mL Nebulization Q6H  . pantoprazole  40 mg Oral Daily  . [START ON 07/31/2017] predniSONE  60 mg Oral Q breakfast   Continuous Infusions:  Active Problems:   NASH (nonalcoholic steatohepatitis) with elevated LFTs   Hypertension   Chronic back pain   Acute respiratory distress   Hyperglycemia   Thrombocytopenia (HCC)   Acute respiratory failure with hypoxia (HCC)    Time spent: 25 minutes    Vassie Loll  [facility] Pager [phone]. If 7PM-7AM, please contact night-coverage at [email or web address], password [facility] 07/30/2017, 5:48 PM  LOS: 1 day

## 2017-07-30 NOTE — Progress Notes (Signed)
SATURATION QUALIFICATIONS: (This note is used to comply with regulatory documentation for home oxygen)  Patient Saturations on Room Air at Rest = 93%  Patient Saturations on Room Air while Ambulating = 90%  Patient Saturations on 0 Liters of oxygen while Ambulating = 90-91%  Please briefly explain why patient needs home oxygen: She does not need o2 at this time

## 2017-07-30 NOTE — Progress Notes (Signed)
Spoke to patient about diagnosis of pre-diabetes.  States that she has been told that she has "borderline" diabetes.  Explained that the steroids that she takes will cause her blood sugars to be elevated. Daughter was asking questions about her diet. Recommended cutting back on sugary drinks. States that she drinks gatorade and had some in her room.  Told her that there is a G2 low sugar kind, but she stated that she could "water" the regular Gatorade down. Recommended that she follow up with her PCP.  Ordered Living Well with Diabetes booklet for her. Patient finally "tuned me out".    Smith Mince RN BSN CDE Diabetes Coordinator Pager: [phone]  8am-5pm

## 2017-07-31 DIAGNOSIS — J441 Chronic obstructive pulmonary disease with (acute) exacerbation: Secondary | ICD-10-CM

## 2017-07-31 LAB — GLUCOSE, CAPILLARY
Glucose-Capillary: 101 mg/dL — ABNORMAL HIGH (ref 65–99)
Glucose-Capillary: 218 mg/dL — ABNORMAL HIGH (ref 65–99)

## 2017-07-31 MED ORDER — PREDNISONE 20 MG PO TABS: ORAL_TABLET | ORAL | 0 refills | Status: DC

## 2017-07-31 MED ORDER — ALBUTEROL SULFATE HFA 108 (90 BASE) MCG/ACT IN AERS: 2.0000 | INHALATION_SPRAY | Freq: Four times a day (QID) | RESPIRATORY_TRACT | 3 refills | Status: AC | PRN

## 2017-07-31 MED ORDER — SACCHAROMYCES BOULARDII 250 MG PO CAPS: 250.0000 mg | ORAL_CAPSULE | Freq: Two times a day (BID) | ORAL | 0 refills | Status: DC

## 2017-07-31 MED ORDER — PANTOPRAZOLE SODIUM 40 MG PO TBEC: 40.0000 mg | DELAYED_RELEASE_TABLET | Freq: Every day | ORAL | 1 refills | Status: DC

## 2017-07-31 MED ORDER — TIOTROPIUM BROMIDE MONOHYDRATE 18 MCG IN CAPS: 18.0000 ug | ORAL_CAPSULE | Freq: Every day | RESPIRATORY_TRACT | 2 refills | Status: AC

## 2017-07-31 MED ORDER — DOXYCYCLINE HYCLATE 100 MG PO TABS: 100.0000 mg | ORAL_TABLET | Freq: Two times a day (BID) | ORAL | 0 refills | Status: AC

## 2017-07-31 MED ORDER — BUDESONIDE-FORMOTEROL FUMARATE 160-4.5 MCG/ACT IN AERO: 2.0000 | INHALATION_SPRAY | Freq: Two times a day (BID) | RESPIRATORY_TRACT | 3 refills | Status: AC

## 2017-07-31 MED ORDER — DM-GUAIFENESIN ER 30-600 MG PO TB12: 1.0000 | ORAL_TABLET | Freq: Two times a day (BID) | ORAL | 0 refills | Status: AC

## 2017-07-31 NOTE — Progress Notes (Signed)
SATURATION QUALIFICATIONS: (This note is used to comply with regulatory documentation for home oxygen)  Patient Saturations on Room Air at Rest = 96%  Patient Saturations on Room Air while Ambulating = 92%  Patient Saturations on 0 Liters of oxygen while Ambulating = 92%  Please briefly explain why patient needs home oxygen: A little short of breath after after walking.

## 2017-07-31 NOTE — Discharge Summary (Signed)
Physician Discharge Summary  Candice Roy ZOX:[id number] DOB: February 16, 1956 DOA: 07/28/2017  PCP: System, Pcp Not In  Admit date: 07/28/2017 Discharge date: 07/31/2017  Time spent: 35 minutes  Recommendations for Outpatient Follow-up:  Close follow up to CBG and A1C with initiation of hypoglycemic regimen if needed in near future (prediabetes found at this time, A1C 6.3; CBG's decompensated due to steroids) Repeat BMET to follow electrolytes and renal function  Follow BP and start antihypertensive regimen if needed (borderline currently; patient looking to pursuit aggressive lifestyle changes) Needs PFT's and further adjustment to maintenance therapy for presumed COPD.   Discharge Diagnoses:  Active Problems:   NASH (nonalcoholic steatohepatitis) with elevated LFTs   Hypertension   Diverticulitis, colon   Chronic back pain   Acute respiratory distress   Hyperglycemia   Thrombocytopenia (HCC)   Acute respiratory failure with hypoxia (HCC)   COPD with acute exacerbation (HCC)   Discharge Condition: stable and improved. Discharge home with instructions to follow up with PCP in 10 days. Will also arrange follow up with pulmonary service, for PFT's and adjustment on medications for presumed COPD.  Diet recommendation: heart healthy and modified carbohydrates.   Filed Weights   07/29/17 0500 07/30/17 0601 07/31/17 0451  Weight: 94.5 kg (208 lb 5.4 oz) 96.3 kg (212 lb 4.9 oz) 97.1 kg (214 lb 1.1 oz)    History of present illness:  61 y.o.femalewith medical history significant for HTN not on meds, History fatty liver/ NASH remote history of smoking, discontinued in January 2018, presenting with one week of increasing shortness of breath with ambulation, with increased cough over the last 2 to 3 days, light green sputum production, and wheezing. Found to have presumed COPD exacerbation with bronchiectasis.   Hospital Course:  1-acute resp failure with hypoxia -due to presumed COPD  exacerbation and bronchiectasis  -Will initiate steroids tapering, continue doxycycline, continue the use of flutter valve and also the use of Mucinex. -at discharge was able to have oxygen discontinued (92-93% on exertion and 96% at rest, on RA). -Patient will require pulmonary function tests as an outpatient and follow-up with the pulmonary service for initiation/adjustment on maintenance therapy. -At discharge using albuterol as a rescue inhaler, Spiriva daily and the use of Symbicort twice a day for maintenance initiation.  2-hyperglycemia/prediabetes -Hemoglobin A1c 6.3 (patient qualifying for prediabetes) -Low carbohydrate diet has been recommended -received insulin while inpatient due to elevated CBG's from steroids usage.  -Will require close follow-up as an outpatient and most likely initiation of hypoglycemic regimen in the near future. -patient pursuing aggressive lifestyle modifications and understand if unable to achive control will need medications   3-lung nodule: with hx of smoking -will need repeat CT scan in the next 3 months -pulmonary service follow up to be arranged at discharge -no red flags symptoms   4-obesity type 1 -Body mass index is 33.25 kg/m. -low calorie diet and exercise discussed with patient.  5-HTN -patient was not on meds at home -Advised to follow a heart healthy diet. -close follow up with PCP and reassessment of BP at that time; initiation of antihypertensive regimen if needed at that time -patient pursuing aggressive lifestyle changes.   6-GERD -will discharge on PPI  Procedures:  See below for x-ray reports   Consultations:  None   Discharge Exam: Vitals:   07/31/17 1303 07/31/17 1444  BP:  (!) 149/81  Pulse:  73  Resp:    Temp:  98.4 F (36.9 C)  SpO2: 96% 94%  General:  Patient reports significant improvement in her breathing; no requiring oxygen supplementation (at rest or exertion) and also able to speak in full  sentences. Denies chest pain.  Cardiovascular: S1 and S2, no rubs, no gallops, no murmurs, no JVD.   Respiratory: very mild exp wheezing on exam; improved air movement, no using accessory muscles and with normal resp effort..  Abdomen: soft, NT, ND, positive BS  Musculoskeletal: no cyanosis, no clubbing, no edema.   Discharge Instructions   Discharge Instructions    Diet - low sodium heart healthy    Complete by:  As directed    Discharge instructions    Complete by:  As directed    Keep yourself well hydrated Follow heart healthy and modified carbohydrates diet  Arrange follow up with PCP in 10 days  Keep yourself smoking free (including avoidance of second hand smoking) Please contact pulmonologist office to have appointment set up     Current Discharge Medication List    START taking these medications   Details  albuterol (PROVENTIL HFA;VENTOLIN HFA) 108 (90 Base) MCG/ACT inhaler Inhale 2 puffs into the lungs every 6 (six) hours as needed for wheezing or shortness of breath. Qty: 1 Inhaler, Refills: 3    budesonide-formoterol (SYMBICORT) 160-4.5 MCG/ACT inhaler Inhale 2 puffs into the lungs 2 (two) times daily. Qty: 1 Inhaler, Refills: 3    dextromethorphan-guaiFENesin (MUCINEX DM) 30-600 MG 12hr tablet Take 1 tablet by mouth 2 (two) times daily. Qty: 60 tablet, Refills: 0    doxycycline (VIBRA-TABS) 100 MG tablet Take 1 tablet (100 mg total) by mouth every 12 (twelve) hours. Qty: 12 tablet, Refills: 0    pantoprazole (PROTONIX) 40 MG tablet Take 1 tablet (40 mg total) by mouth daily. Qty: 30 tablet, Refills: 1    predniSONE (DELTASONE) 20 MG tablet Take 3 tablets by mouth daily X1 day; then 2 tablets by mouth daily x 2 days; then 1 tablet by mouth daily x 3 days; then 1/2 tablet by mouth daily X 3 days and stop prednisone Qty: 14 tablet, Refills: 0    tiotropium (SPIRIVA HANDIHALER) 18 MCG inhalation capsule Place 1 capsule (18 mcg total) into inhaler and inhale  daily. Qty: 30 capsule, Refills: 2      CONTINUE these medications which have CHANGED   Details  saccharomyces boulardii (FLORASTOR) 250 MG capsule Take 1 capsule (250 mg total) by mouth 2 (two) times daily. Qty: 40 capsule, Refills: 0      STOP taking these medications     amoxicillin-clavulanate (AUGMENTIN) 875-125 MG tablet      diphenhydrAMINE (BENADRYL) 50 MG tablet      gabapentin (NEURONTIN) 300 MG capsule      loperamide (IMODIUM A-D) 2 MG tablet        Allergies  Allergen Reactions  . Aspirin Hives    Childhood allergy- can take alleve/ Naproxen  . Lisinopril Swelling   Follow-up Information    Oretha Milch, MD. Schedule an appointment as soon as possible for a visit in 2 week(s).   Specialty:  Pulmonary Disease Why:  call office to set up appointment. Contact information: [address] [place] [address] [phone]            The results of significant diagnostics from this hospitalization (including imaging, microbiology, ancillary and laboratory) are listed below for reference.    Significant Diagnostic Studies: Dg Chest 2 View  Result Date: 07/29/2017 CLINICAL DATA:  Shortness of breath. EXAM: CHEST  2 VIEW  COMPARISON:  Chest CT 07/28/2017.  Chest x-ray 07/27/2017. FINDINGS: The lungs are clear without focal pneumonia, edema, pneumothorax or pleural effusion. Cardiopericardial silhouette is at upper limits of normal for size. The visualized bony structures of the thorax are intact. IMPRESSION: No acute cardiopulmonary findings. 11 mm anterior right lower lobe pulmonary nodule seen on yesterday's CT scan is not evident by chest x-ray today. Electronically Signed   By: Kennith Center M.D.   On: 07/29/2017 12:27   Dg Chest 2 View  Result Date: 07/27/2017 CLINICAL DATA:  Shortness of breath, onset yesterday. EXAM: CHEST  2 VIEW COMPARISON:  01/13/2010 FINDINGS: The cardiomediastinal contours are normal. Moderate bronchial thickening. No  consolidation. There is a small right pleural effusion. No pneumothorax. No acute osseous abnormalities are seen. IMPRESSION: Moderate bronchial thickening.  Small right pleural effusion. Electronically Signed   By: Rubye Oaks M.D.   On: 07/27/2017 22:46   Ct Angio Chest Pe W Or Wo Contrast  Result Date: 07/28/2017 CLINICAL DATA:  Shortness of breath and wheezing for 2 days. EXAM: CT ANGIOGRAPHY CHEST WITH CONTRAST TECHNIQUE: Multidetector CT imaging of the chest was performed using the standard protocol during bolus administration of intravenous contrast. Multiplanar CT image reconstructions and MIPs were obtained to evaluate the vascular anatomy. CONTRAST:  100 mL Isovue 370 COMPARISON:  None. FINDINGS: Cardiovascular: Satisfactory opacification of pulmonary arteries noted, and no pulmonary emboli identified. No evidence of thoracic aortic dissection or aneurysm. Aortic atherosclerosis. Mediastinum/Nodes: No masses or pathologically enlarged lymph nodes identified. Lungs/Pleura: 11 mm noncalcified nodule is seen in the anterolateral right lower lobe on image 98/7. No other pulmonary nodules or masses identified. No evidence of pleural effusion. Upper abdomen: No acute findings. Musculoskeletal: No suspicious bone lesions identified. Review of the MIP images confirms the above findings. IMPRESSION: No evidence of pulmonary embolism. 11 mm indeterminate nodule in anterior right lower lobe. Consider one of the following in 3 months for both low-risk and high-risk individuals: (a) followup chest CT, (b) PET-CT, or (c) tissue sampling. This recommendation follows the consensus statement: Guidelines for Management of Incidental Pulmonary Nodules Detected on CT Images: From the Fleischner Society 2017; Radiology 2017; 284:228-243. Aortic aneurysm NOS (ICD10-I71.9). Electronically Signed   By: Myles Rosenthal M.D.   On: 07/28/2017 10:26    Microbiology: No results found for this or any previous visit (from the  past 240 hour(s)).   Labs: Basic Metabolic Panel:  Recent Labs Lab 07/27/17 2214 07/29/17 0324 07/30/17 0300  NA 139 138 137  K 3.9 4.1 4.6  CL 103 103 105  CO2 27 25 24   GLUCOSE 207* 199* 229*  BUN 9 10 17   CREATININE 0.89 0.83 0.92  CALCIUM 9.6 9.9 9.2   Liver Function Tests:  Recent Labs Lab 07/29/17 0324  AST 33  ALT 30  ALKPHOS 62  BILITOT 0.7  PROT 8.2*  ALBUMIN 4.2   CBC:  Recent Labs Lab 07/27/17 2214 07/29/17 0324  WBC 9.5 12.2*  HGB 13.0 12.3  HCT 38.7 37.7  MCV 87.0 87.7  PLT 132* 153    CBG:  Recent Labs Lab 07/30/17 1211 07/30/17 1715 07/30/17 2123 07/31/17 0804 07/31/17 1208  GLUCAP 295* 281* 204* 101* 218*      Signed:  Vassie Loll MD.  [facility] 07/31/2017, 3:51 PM

## 2017-07-31 NOTE — Progress Notes (Signed)
Pt is happy to go home. Discharge instructions given to pt. Discharged to home accompanied by daughter.

## 2017-08-09 ENCOUNTER — Ambulatory Visit (INDEPENDENT_AMBULATORY_CARE_PROVIDER_SITE_OTHER): Payer: Medicare Other | Admitting: Pulmonary Disease

## 2017-08-09 ENCOUNTER — Encounter: Payer: Self-pay | Admitting: Pulmonary Disease

## 2017-08-09 VITALS — BP 130/70 | HR 61

## 2017-08-09 DIAGNOSIS — J42 Unspecified chronic bronchitis: Secondary | ICD-10-CM | POA: Diagnosis not present

## 2017-08-09 DIAGNOSIS — J441 Chronic obstructive pulmonary disease with (acute) exacerbation: Secondary | ICD-10-CM | POA: Diagnosis not present

## 2017-08-09 DIAGNOSIS — R911 Solitary pulmonary nodule: Secondary | ICD-10-CM

## 2017-08-09 NOTE — Progress Notes (Signed)
Subjective:    Patient ID: Candice Roy, female    DOB: [birth year], 61 y.o.   MRN: [id number]  HPI  Chief Complaint  Patient presents with  . pulm consult    Pt was referred by Dr. Loa SocksMedra. Pt denies any chest tightness or chest pain, SOB or wheezing. Denies coughing and fever.     61 year old heavy ex-smoker presents for evaluation of COPD. She was recently hospitalized 8/25-8/28 for shortness of breath and wheezing with green sputum, symptoms preceding about one week prior to admission and treated as a COPD exacerbation with doxycycline and IV steroids with significant improvement brought back to her baseline. ABG was 7.39/39/53 and she transiently required oxygen. CT angios 8/25 was negative for pulmonary embolism but showed 13 mm nodule in the right lower lobe  She was discharged on Symbicort and Spiriva, she did not fill the prescription for Spiriva but has been taking Symbicort on an "as needed" basis. She also uses albuterol MDI more often for rescue. She smoked a pack and a half per day starting at age 61 until she quit in 12/2016, about 50-pack-years.  On review of her prior CT scans I note CT abdomen and 04/2016 and 05/2016 which show right lower lobe nodule that was read as "atelectasis"  Spirometry today surprisingly shows no evidence of airway obstruction with a ratio of 83, FEV1 of 76% FVC of 73% more suggestive mild restriction, effort was suboptimal   Past Medical History:  Diagnosis Date  . Bulging disc   . Diverticulitis   . Fatty liver   . Hypertension      Past Surgical History:  Procedure Laterality Date  . ABDOMINAL HYSTERECTOMY    . ANKLE SURGERY    . APPENDECTOMY    . CESAREAN SECTION      Allergies  Allergen Reactions  . Aspirin Hives    Childhood allergy- can take alleve/ Naproxen  . Lisinopril Swelling    Social History   Social History  . Marital status: Single    Spouse name: N/A  . Number of children: N/A  . Years of education: N/A     Occupational History  . Not on file.   Social History Main Topics  . Smoking status: Former Smoker    Packs/day: 0.50    Years: 20.00    Quit date: 04/09/2017  . Smokeless tobacco: Never Used     Comment: pt states she does not smoke  . Alcohol use Yes     Comment: 4 or 5 drinks per day with soda and ice -04/19/16  . Drug use: No  . Sexual activity: Not on file   Other Topics Concern  . Not on file   Social History Narrative  . No narrative on file      Family History  Problem Relation Age of Onset  . Deep vein thrombosis Mother   . Cancer - Other Sister       Review of Systems  Constitutional: Negative for fever and unexpected weight change.  HENT: Negative for congestion, dental problem, ear pain, nosebleeds, postnasal drip, rhinorrhea, sinus pressure, sneezing, sore throat and trouble swallowing.   Eyes: Negative for redness and itching.  Respiratory: Negative for cough, chest tightness, shortness of breath and wheezing.   Cardiovascular: Negative for palpitations and leg swelling.  Gastrointestinal: Negative for nausea and vomiting.  Genitourinary: Negative for dysuria.  Musculoskeletal: Negative for joint swelling.  Skin: Negative for rash.  Allergic/Immunologic: Negative.  Negative for environmental allergies, food  allergies and immunocompromised state.  Neurological: Negative for headaches.  Hematological: Does not bruise/bleed easily.  Psychiatric/Behavioral: Negative for dysphoric mood. The patient is not nervous/anxious.        Objective:   Physical Exam  Gen. Pleasant, obese, in no distress, normal affect ENT - no lesions, no post nasal drip, class 2 airway Neck: No JVD, no thyromegaly, no carotid bruits Lungs: no use of accessory muscles, no dullness to percussion, decreased without rales or rhonchi  Cardiovascular: Rhythm regular, heart sounds  normal, no murmurs or gallops, no peripheral edema Abdomen: soft and non-tender, no hepatosplenomegaly,  BS normal. Musculoskeletal: No deformities, no cyanosis or clubbing Neuro:  alert, non focal, no tremors       Assessment & Plan:

## 2017-08-09 NOTE — Patient Instructions (Signed)
CT chest no contrast in 3 months to follow-up nodule in right lower lobe  Breathing test today Stay on Symbicort 2 puffs twice daily

## 2017-08-09 NOTE — Assessment & Plan Note (Signed)
CT chest no contrast in 3 months to follow-up nodule in right lower lobe  We will ask radiologist to review CT abdomen, seems like this nodule was present in 04/2016 in which case more than one year of stability would mean that this is likely a benign nodule

## 2017-08-09 NOTE — Assessment & Plan Note (Signed)
Surprisingly she does not have significant airway obstruction and spirometry suggest mild restriction. I have however asked her to stay on Symbicort given her recent exacerbation, we'll reassess the need for this medication in 3 months  Smoking cessation was again emphasized, she does not feel the urge She does not feel like she needs pulmonary rehabilitation at this time

## 2017-11-05 ENCOUNTER — Inpatient Hospital Stay: Admission: RE | Admit: 2017-11-05 | Payer: Medicare Other | Source: Ambulatory Visit

## 2017-11-16 ENCOUNTER — Inpatient Hospital Stay: Admission: RE | Admit: 2017-11-16 | Payer: Medicare Other | Source: Ambulatory Visit

## 2017-11-21 ENCOUNTER — Ambulatory Visit (INDEPENDENT_AMBULATORY_CARE_PROVIDER_SITE_OTHER)
Admission: RE | Admit: 2017-11-21 | Discharge: 2017-11-21 | Disposition: A | Discharge status: Home / Self Care | Payer: Medicare Other | Source: Ambulatory Visit | Attending: Pulmonary Disease | Admitting: Pulmonary Disease

## 2017-11-21 DIAGNOSIS — R911 Solitary pulmonary nodule: Secondary | ICD-10-CM

## 2019-02-18 ENCOUNTER — Emergency Department (HOSPITAL_COMMUNITY): Payer: Medicare Other

## 2019-02-18 ENCOUNTER — Encounter (HOSPITAL_COMMUNITY): Payer: Self-pay

## 2019-02-18 ENCOUNTER — Emergency Department (HOSPITAL_COMMUNITY)
Admission: EM | Admit: 2019-02-18 | Discharge: 2019-02-18 | Disposition: A | Discharge status: Home / Self Care | Payer: Medicare Other | Attending: Emergency Medicine | Admitting: Emergency Medicine

## 2019-02-18 ENCOUNTER — Other Ambulatory Visit: Payer: Self-pay

## 2019-02-18 DIAGNOSIS — Z87891 Personal history of nicotine dependence: Secondary | ICD-10-CM | POA: Insufficient documentation

## 2019-02-18 DIAGNOSIS — Y998 Other external cause status: Secondary | ICD-10-CM | POA: Insufficient documentation

## 2019-02-18 DIAGNOSIS — S8992XA Unspecified injury of left lower leg, initial encounter: Secondary | ICD-10-CM | POA: Diagnosis present

## 2019-02-18 DIAGNOSIS — Y92512 Supermarket, store or market as the place of occurrence of the external cause: Secondary | ICD-10-CM | POA: Insufficient documentation

## 2019-02-18 DIAGNOSIS — S8392XA Sprain of unspecified site of left knee, initial encounter: Secondary | ICD-10-CM | POA: Diagnosis not present

## 2019-02-18 DIAGNOSIS — Y9301 Activity, walking, marching and hiking: Secondary | ICD-10-CM | POA: Diagnosis not present

## 2019-02-18 DIAGNOSIS — W010XXA Fall on same level from slipping, tripping and stumbling without subsequent striking against object, initial encounter: Secondary | ICD-10-CM | POA: Insufficient documentation

## 2019-02-18 DIAGNOSIS — J449 Chronic obstructive pulmonary disease, unspecified: Secondary | ICD-10-CM | POA: Insufficient documentation

## 2019-02-18 DIAGNOSIS — I10 Essential (primary) hypertension: Secondary | ICD-10-CM | POA: Diagnosis not present

## 2019-02-18 NOTE — ED Provider Notes (Signed)
[facility]-EMERGENCY DEPT Provider Note   CSN: [id number] Arrival date & time: 02/18/19  1720    History   Chief Complaint Chief Complaint  Patient presents with  . Fall    HPI Candice Roy is a 63 y.o. female.     95 54-year-old female presents after mechanical fall prior to arrival.  No head or neck discomfort.  Felt a twisting of her left knee.  Complains of dull pain to that area which radiates to her distal left thigh.  Denies any hip discomfort.  No back pain.  Pain worse with movement and better with rest.     Past Medical History:  Diagnosis Date  . Bulging disc   . Diverticulitis   . Fatty liver   . Hypertension     Patient Active Problem List   Diagnosis Date Noted  . Pulmonary nodule 08/09/2017  . COPD with acute exacerbation (HCC)   . Chronic back pain 07/28/2017  . Hyperglycemia 07/28/2017  . Thrombocytopenia (HCC) 07/28/2017  . Diverticulitis of colon 05/08/2016  . Diverticulitis, colon 05/08/2016  . Acute diverticulitis 04/19/2016  . NASH (nonalcoholic steatohepatitis) with elevated LFTs 04/19/2016  . Tobacco abuse disorder 04/19/2016  . Hypertension 04/19/2016  . Periorbital edema of left eye 04/19/2016  . Bulging disc   . Fatty liver     Past Surgical History:  Procedure Laterality Date  . ABDOMINAL HYSTERECTOMY    . ANKLE SURGERY    . APPENDECTOMY    . CESAREAN SECTION       OB History   No obstetric history on file.      Home Medications    Prior to Admission medications   Medication Sig Start Date End Date Taking? Authorizing Provider  albuterol (PROVENTIL HFA;VENTOLIN HFA) 108 (90 Base) MCG/ACT inhaler Inhale 2 puffs into the lungs every 6 (six) hours as needed for wheezing or shortness of breath. 07/31/17   Vassie Loll, MD  budesonide-formoterol [facility]) 160-4.5 MCG/ACT inhaler Inhale 2 puffs into the lungs 2 (two) times daily. 07/31/17   Vassie Loll, MD  dextromethorphan-guaiFENesin [facility] DM)  30-600 MG 12hr tablet Take 1 tablet by mouth 2 (two) times daily. 07/31/17   Vassie Loll, MD  saccharomyces boulardii (FLORASTOR) 250 MG capsule Take 1 capsule (250 mg total) by mouth 2 (two) times daily. 07/31/17   Vassie Loll, MD  tiotropium (SPIRIVA HANDIHALER) 18 MCG inhalation capsule Place 1 capsule (18 mcg total) into inhaler and inhale daily. Patient not taking: Reported on 08/09/2017 07/31/17 07/31/18  Vassie Loll, MD    Family History Family History  Problem Relation Age of Onset  . Deep vein thrombosis Mother   . Cancer - Other Sister     Social History Social History   Tobacco Use  . Smoking status: Former Smoker    Packs/day: 0.50    Years: 20.00    Pack years: 10.00    Last attempt to quit: 04/09/2017    Years since quitting: 1.8  . Smokeless tobacco: Never Used  . Tobacco comment: pt states she does not smoke  Substance Use Topics  . Alcohol use: Yes    Comment: 4 or 5 drinks per day with soda and ice -04/19/16  . Drug use: No     Allergies   Aspirin and Lisinopril   Review of Systems Review of Systems  All other systems reviewed and are negative.    Physical Exam Updated Vital Signs BP (!) 174/92 (BP Location: Left Arm)   Pulse  77   Temp 98.3 F (36.8 C) (Oral)   Resp 16   Ht 1.676 m (5\' 6" )   Wt 98.4 kg   SpO2 93%   BMI 35.02 kg/m   Physical Exam Vitals signs and nursing note reviewed.  Constitutional:      General: She is not in acute distress.    Appearance: Normal appearance. She is well-developed. She is not toxic-appearing.  HENT:     Head: Normocephalic and atraumatic.  Eyes:     General: Lids are normal.     Conjunctiva/sclera: Conjunctivae normal.     Pupils: Pupils are equal, round, and reactive to light.  Neck:     Musculoskeletal: Normal range of motion and neck supple.     Thyroid: No thyroid mass.     Trachea: No tracheal deviation.  Cardiovascular:     Rate and Rhythm: Normal rate and regular rhythm.     Heart  sounds: Normal heart sounds. No murmur. No gallop.   Pulmonary:     Effort: Pulmonary effort is normal. No respiratory distress.     Breath sounds: Normal breath sounds. No stridor. No decreased breath sounds, wheezing, rhonchi or rales.  Abdominal:     General: Bowel sounds are normal. There is no distension.     Palpations: Abdomen is soft.     Tenderness: There is no abdominal tenderness. There is no rebound.  Musculoskeletal:        General: No tenderness.     Left knee: She exhibits decreased range of motion. She exhibits no swelling and no effusion.     Comments: Pain with anterior movement of the knee.  Tender along the distal left femur  Skin:    General: Skin is warm and dry.     Findings: No abrasion or rash.  Neurological:     Mental Status: She is alert and oriented to person, place, and time.     GCS: GCS eye subscore is 4. GCS verbal subscore is 5. GCS motor subscore is 6.     Cranial Nerves: No cranial nerve deficit.     Sensory: No sensory deficit.  Psychiatric:        Speech: Speech normal.        Behavior: Behavior normal.      ED Treatments / Results  Labs (all labs ordered are listed, but only abnormal results are displayed) Labs Reviewed - No data to display  EKG None  Radiology No results found.  Procedures Procedures (including critical care time)  Medications Ordered in ED Medications - No data to display   Initial Impression / Assessment and Plan / ED Course  I have reviewed the triage vital signs and the nursing notes.  Pertinent labs & imaging results that were available during my care of the patient were reviewed by me and considered in my medical decision making (see chart for details).      Patient is x-rays reviewed and no evidence of acute fracture.  Suspect internal derangement of possible ACL or PCL.  Will place a knee immobilizer as well as crutches and will give orthopedic referral.    Final Clinical Impressions(s) / ED  Diagnoses   Final diagnoses:  None    ED Discharge Orders    None       Lorre Nick, MD 02/18/19 Paulo Fruit

## 2019-02-18 NOTE — Discharge Instructions (Signed)
Use Tylenol or Motrin as directed for pain 

## 2019-02-18 NOTE — ED Triage Notes (Signed)
Per EMS-slipped in some water at grocery store-husband witnessed fall-left knee injury, unable to bear weight-no deformity, swelling or bruising

## 2019-02-18 NOTE — ED Triage Notes (Signed)
Pt is alert and orinted x 3 and is verbally responsive. PT reports left leg pain and states that her leg got twisted behind her. + PMS x 4 ext. 4/10 pain at resting 10/10 with ambulation. Denies any other injuries or LOC.

## 2019-02-18 NOTE — ED Notes (Signed)
Patient transported to X-ray 

## 2019-02-18 NOTE — ED Notes (Signed)
Bed: [facility] Expected date:  Expected time:  Means of arrival:  Comments: EMS-fall 

## 2022-03-07 ENCOUNTER — Other Ambulatory Visit: Payer: Self-pay | Admitting: Physician Assistant

## 2022-03-07 DIAGNOSIS — Z1231 Encounter for screening mammogram for malignant neoplasm of breast: Secondary | ICD-10-CM

## 2024-05-20 ENCOUNTER — Emergency Department (HOSPITAL_COMMUNITY)
Admission: EM | Admit: 2024-05-20 | Discharge: 2024-05-20 | Disposition: A | Discharge status: Home / Self Care | Attending: Emergency Medicine | Admitting: Emergency Medicine

## 2024-05-20 ENCOUNTER — Other Ambulatory Visit: Payer: Self-pay

## 2024-05-20 DIAGNOSIS — J449 Chronic obstructive pulmonary disease, unspecified: Secondary | ICD-10-CM | POA: Diagnosis not present

## 2024-05-20 DIAGNOSIS — Z720 Tobacco use: Secondary | ICD-10-CM | POA: Diagnosis not present

## 2024-05-20 DIAGNOSIS — W57XXXA Bitten or stung by nonvenomous insect and other nonvenomous arthropods, initial encounter: Secondary | ICD-10-CM | POA: Insufficient documentation

## 2024-05-20 DIAGNOSIS — S70361A Insect bite (nonvenomous), right thigh, initial encounter: Secondary | ICD-10-CM | POA: Insufficient documentation

## 2024-05-20 LAB — COMPREHENSIVE METABOLIC PANEL WITH GFR
ALT: 33 U/L (ref 0–44)
AST: 49 U/L — ABNORMAL HIGH (ref 15–41)
Albumin: 4.3 g/dL (ref 3.5–5.0)
Alkaline Phosphatase: 61 U/L (ref 38–126)
Anion gap: 11 (ref 5–15)
BUN: 7 mg/dL — ABNORMAL LOW (ref 8–23)
CO2: 24 mmol/L (ref 22–32)
Calcium: 9.6 mg/dL (ref 8.9–10.3)
Chloride: 104 mmol/L (ref 98–111)
Creatinine, Ser: 0.64 mg/dL (ref 0.44–1.00)
GFR, Estimated: >60 mL/min (ref >60)
Glucose, Bld: 103 mg/dL — ABNORMAL HIGH (ref 70–99)
Potassium: 4 mmol/L (ref 3.5–5.1)
Sodium: 139 mmol/L (ref 135–145)
Total Bilirubin: 1.6 mg/dL — ABNORMAL HIGH (ref 0.0–1.2)
Total Protein: 8.7 g/dL — ABNORMAL HIGH (ref 6.5–8.1)

## 2024-05-20 LAB — CBC WITH DIFFERENTIAL/PLATELET
Abs Immature Granulocytes: 0.02 10*3/uL (ref 0.00–0.07)
Basophils Absolute: 0.1 10*3/uL (ref 0.0–0.1)
Basophils Relative: 1 %
Eosinophils Absolute: 0.3 10*3/uL (ref 0.0–0.5)
Eosinophils Relative: 5 %
HCT: 40.9 % (ref 36.0–46.0)
Hemoglobin: 13.7 g/dL (ref 12.0–15.0)
Immature Granulocytes: 0 %
Lymphocytes Relative: 31 %
Lymphs Abs: 1.8 10*3/uL (ref 0.7–4.0)
MCH: 32.5 pg (ref 26.0–34.0)
MCHC: 33.5 g/dL (ref 30.0–36.0)
MCV: 96.9 fL (ref 80.0–100.0)
Monocytes Absolute: 0.5 10*3/uL (ref 0.1–1.0)
Monocytes Relative: 8 %
Neutro Abs: 3.2 10*3/uL (ref 1.7–7.7)
Neutrophils Relative %: 55 %
Platelets: 128 10*3/uL — ABNORMAL LOW (ref 150–400)
RBC: 4.22 MIL/uL (ref 3.87–5.11)
RDW: 17 % — ABNORMAL HIGH (ref 11.5–15.5)
WBC: 5.8 10*3/uL (ref 4.0–10.5)
nRBC: 0 % (ref 0.0–0.2)

## 2024-05-20 MED ORDER — DOXYCYCLINE HYCLATE 100 MG PO TABS
100.0000 mg | ORAL_TABLET | Freq: Once | ORAL | Status: AC
  Administered 2024-05-20: 100 mg via ORAL
  Filled 2024-05-20: qty 1

## 2024-05-20 MED ORDER — DOXYCYCLINE HYCLATE 100 MG PO CAPS: 100.0000 mg | ORAL_CAPSULE | Freq: Two times a day (BID) | ORAL | 0 refills | Status: DC

## 2024-05-20 NOTE — ED Provider Notes (Signed)
 [place] EMERGENCY DEPARTMENT AT [facility] Provider Note   CSN: [id number] Arrival date & time: 05/20/24  1000     Patient presents with: Insect Bite (Pt endorses she was bit by a tick on R thigh 3.5 weeks ago, and now has leg pain, face arms and leg rash, endorses sob at this time as well as fatigue )   Candice Roy is a 68 y.o. female.   68 year old female presenting with recent tick bite.  Patient was bitten by a tick about 3 and half weeks ago, says the tick was attached to her right inner thigh at time of removal but was not very large, she does not suspect it had been there long.  Around 2 weeks ago she began to feel generally rundown, describes occasional muscle aches/spasms, has noticed the development of a rash of her right lower extremity and face.  Denies chest pain, shortness of breath, nausea/vomiting/diarrhea/abdominal pain. History of COPD.        Prior to Admission medications   Medication Sig Start Date End Date Taking? Authorizing Provider  albuterol  (PROVENTIL  HFA;VENTOLIN  HFA) 108 (90 Base) MCG/ACT inhaler Inhale 2 puffs into the lungs every 6 (six) hours as needed for wheezing or shortness of breath. 07/31/17   Justina Oman, MD  budesonide -formoterol  (SYMBICORT ) 160-4.5 MCG/ACT inhaler Inhale 2 puffs into the lungs 2 (two) times daily. 07/31/17   Justina Oman, MD  dextromethorphan -guaiFENesin  (MUCINEX  DM) 30-600 MG 12hr tablet Take 1 tablet by mouth 2 (two) times daily. 07/31/17   Justina Oman, MD  saccharomyces boulardii (FLORASTOR) 250 MG capsule Take 1 capsule (250 mg total) by mouth 2 (two) times daily. 07/31/17   Justina Oman, MD  tiotropium (SPIRIVA  HANDIHALER) 18 MCG inhalation capsule Place 1 capsule (18 mcg total) into inhaler and inhale daily. Patient not taking: Reported on 08/09/2017 07/31/17 07/31/18  Justina Oman, MD    Allergies: Aspirin and Lisinopril    Review of Systems  Updated Vital Signs BP (!) 162/94 (BP Location: Left  Arm)   Pulse 84   Temp 98.3 F (36.8 C) (Oral)   Resp 16   SpO2 92%   Physical Exam Vitals and nursing note reviewed.  HENT:     Head: Normocephalic.     Mouth/Throat:     Mouth: Mucous membranes are moist.   Eyes:     Extraocular Movements: Extraocular movements intact.     Pupils: Pupils are equal, round, and reactive to light.    Cardiovascular:     Rate and Rhythm: Normal rate and regular rhythm.  Pulmonary:     Effort: Pulmonary effort is normal.     Comments: Diffuse expiratory wheeze, primarily heard in right upper lung field  Musculoskeletal:     Cervical back: Normal range of motion.     Comments: Moves all extremities spontaneously without difficulty Grip strength equal and intact bilaterally   Skin:    Comments: Small residual area of skin thickening at site of previous tick attachment, see photo Rash to RLE and face, see photos    Neurological:     Mental Status: She is alert and oriented to person, place, and time.     Sensory: No sensory deficit.          (all labs ordered are listed, but only abnormal results are displayed) Labs Reviewed - No data to display  EKG: None  Radiology: No results found.   Procedures   Medications Ordered in the ED - No data to display  Medical Decision Making This patient presents to the ED for concern of tick bite, this involves an extensive number of treatment options, and is a complaint that carries with it a high risk of complications and morbidity.  The differential diagnosis includes Lyme disease, RMSF, other tick borne illness.   Co morbidities that complicate the patient evaluation  COPD, NASH/fatty liver, h/o thrombocytopenia   Additional history obtained:  Additional history obtained from record review External records from outside source obtained and reviewed including prior ED notes   Lab Tests:  I Ordered, and personally interpreted labs.  The  pertinent results include: CBC notable for mild thrombocytopenia with platelet count of 128, however patient had similar findings 6 years ago, no more recent data for comparison.  CMP notable for mild elevation in AST at 49, mild elevation in total bilirubin at 1.6; I do not have a more recent value for comparison, however patient has been diagnosed with fatty liver in the past. Lyme/spotted fever antibodies/ehrlichia labs pending at time of discharge.   Cardiac Monitoring: / EKG:  The patient was maintained on a cardiac monitor.  I personally viewed and interpreted the cardiac monitored which showed an underlying rhythm of: NSR    Problem List / ED Course / Critical interventions / Medication management  I ordered medication including Doxycycline   for suspected Lyme disease  Reevaluation of the patient after these medicines showed that the patient stayed the same I have reviewed the patients home medicines and have made adjustments as needed   Social Determinants of Health:  Tobacco use   Test / Admission - Considered:  See above for detailed physical exam findings/photos.  I suspect that this patient may have developed a tickborne illness such as Lyme disease, however her vitals are reassuring, and lab work is largely unremarkable as above.  There is currently no target lesion/rash surrounding site of tick attachment.  She was given her first dose of doxycycline  in the emergency department today, I prescribed a 10-day course of doxycycline  for suspected Lyme disease. Lyme/spotted fever antibodies/ehrlichia labs pending at time of discharge.  Return precautions discussed.  Recommend close follow-up with primary care provider this week.  Patient voiced understanding and is in agreement with this plan.  She is approved for discharge at this time.   Staffed with Dr. Efraim Grange  Amount and/or Complexity of Data Reviewed Labs: ordered.  Risk Prescription drug management.        Final  diagnoses:  Tick bite of right thigh, initial encounter    ED Discharge Orders     None          Adolm Ahumada 05/20/24 1342    Ninetta Basket, MD 05/20/24 [phone]

## 2024-05-20 NOTE — Discharge Instructions (Addendum)
 Start doxycycline , 1 tablet by mouth twice daily for 10 days, you received your first dose of this medication in the emergency department today.  Follow-up with your primary care provider later this week.  Return to the emergency department if your symptoms worsen.

## 2024-05-21 LAB — LYME DISEASE SEROLOGY W/REFLEX: Lyme Total Antibody EIA: NEGATIVE

## 2024-05-23 LAB — EHRLICHIA ANTIBODY PANEL
E chaffeensis (HGE) Ab, IgG: NEGATIVE
E chaffeensis (HGE) Ab, IgM: NEGATIVE
E. Chaffeensis (HME) IgM Titer: NEGATIVE
E.Chaffeensis (HME) IgG: NEGATIVE

## 2024-05-24 LAB — SPOTTED FEVER GROUP ANTIBODIES
Spotted Fever Group IgG: <1:64 {titer}
Spotted Fever Group IgM: <1:64 {titer}

## 2024-07-16 ENCOUNTER — Other Ambulatory Visit: Payer: Self-pay | Admitting: Nurse Practitioner

## 2024-07-16 DIAGNOSIS — D696 Thrombocytopenia, unspecified: Secondary | ICD-10-CM

## 2024-07-16 NOTE — Progress Notes (Addendum)
 [facility]   Telephone:([phone]) [phone] Fax:([phone]) [phone]   Clinic New consult Note   Patient Care Team: Pcp, No as PCP - General 07/17/2024  CHIEF COMPLAINTS/PURPOSE OF CONSULTATION:  Thrombocytopenia, referred by PCP Dr. Lannie Purdue  HISTORY OF PRESENTING ILLNESS:  Candice Roy 68 y.o. female with PMH including HTN, COPD, diverticulitis, NASH, chronic back pain, and alcohol/tobacco use is here because of low platelet count. The platelet count has been intermittently low since at least 2011 - 2017 in the range of 114 - 217K. She was admitted to the hospital in 04/2016 with acute diverticulitis and platelets dropped to 88K then normalized after recovery. There is a gap in her records from 2018 to 05/20/2024 where she was found to have recurrent low platelet count 128K during work up for tick bite, CBC otherwise normal.   Socially, she is single. Total of 3 children, 2 are deceased 1 living daughter. She is unemployed and disabled from chronic back pain. Independent with ADLs. Drinks 1 bottle wine daily since ~2014, smokes cigarettes daily up to 1 PPD x50 years. Last mammo 2016 has not gone back due to painful procedure and fear. Reportedly up to date on colonoscopy.   Today she presents with her daughter. Feels well in general now that she has completed antibiotics and recovered from tick bite. Energy/appetite are normal. Bowels moving normally. Not on oxygen for COPD. Denies bruising or bleeding. Denies fever, night sweats, unintentional weight loss, lymphadenopathy.    MEDICAL HISTORY:  Past Medical History:  Diagnosis Date   Bulging disc    Diverticulitis    Fatty liver    Hypertension     SURGICAL HISTORY: Past Surgical History:  Procedure Laterality Date   ABDOMINAL HYSTERECTOMY     ANKLE SURGERY     APPENDECTOMY     CESAREAN SECTION      SOCIAL HISTORY: Social History   Socioeconomic History   Marital status: Single    Spouse name: Not on file   Number  of children: Not on file   Years of education: Not on file   Highest education level: Not on file  Occupational History   Not on file  Tobacco Use   Smoking status: Former    Current packs/day: 0.00    Average packs/day: 0.5 packs/day for 20.0 years (10.0 ttl pk-yrs)    Types: Cigarettes    Start date: 04/09/1997    Quit date: 04/09/2017    Years since quitting: 7.2   Smokeless tobacco: Never   Tobacco comments:    pt states she does not smoke  Vaping Use   Vaping status: Never Used  Substance and Sexual Activity   Alcohol use: Yes    Comment: 4 or 5 drinks per day with soda and ice -04/19/16   Drug use: No   Sexual activity: Not on file  Other Topics Concern   Not on file  Social History Narrative   Not on file   Social Drivers of Health   Financial Resource Strain: Not on file  Food Insecurity: Food Insecurity Present (07/17/2024)   Hunger Vital Sign    Worried About Running Out of Food in the Last Year: Never true    Ran Out of Food in the Last Year: Sometimes true  Transportation Needs: No Transportation Needs (07/17/2024)   PRAPARE - Administrator, Civil Service (Medical): No    Lack of Transportation (Non-Medical): No  Physical Activity: Not on file  Stress: Not on  file  Social Connections: Not on file  Intimate Partner Violence: Not At Risk (07/17/2024)   Humiliation, Afraid, Rape, and Kick questionnaire    Fear of Current or Ex-Partner: No    Emotionally Abused: No    Physically Abused: No    Sexually Abused: No    FAMILY HISTORY: Family History  Problem Relation Age of Onset   Deep vein thrombosis Mother    Cancer - Other Sister     ALLERGIES:  is allergic to aspirin and lisinopril.  MEDICATIONS:  Current Outpatient Medications  Medication Sig Dispense Refill   Garlic 1000 MG CAPS Take 1,000 mg by mouth daily.     albuterol  (PROVENTIL  HFA;VENTOLIN  HFA) 108 (90 Base) MCG/ACT inhaler Inhale 2 puffs into the lungs every 6 (six) hours as needed  for wheezing or shortness of breath. (Patient not taking: Reported on 07/17/2024) 1 Inhaler 3   budesonide -formoterol  (SYMBICORT ) 160-4.5 MCG/ACT inhaler Inhale 2 puffs into the lungs 2 (two) times daily. (Patient not taking: Reported on 07/17/2024) 1 Inhaler 3   dextromethorphan -guaiFENesin  (MUCINEX  DM) 30-600 MG 12hr tablet Take 1 tablet by mouth 2 (two) times daily. (Patient not taking: Reported on 07/17/2024) 60 tablet 0   tiotropium (SPIRIVA  HANDIHALER) 18 MCG inhalation capsule Place 1 capsule (18 mcg total) into inhaler and inhale daily. (Patient not taking: Reported on 07/17/2024) 30 capsule 2   No current facility-administered medications for this visit.    REVIEW OF SYSTEMS:   Constitutional: Denies fevers, chills, unintentional weight loss, or abnormal night sweats Eyes: Denies blurriness of vision, double vision or watery eyes Ears, nose, mouth, throat, and face: Denies mucositis or sore throat Respiratory: Denies cough, dyspnea or wheezes (+) COPD Cardiovascular: Denies palpitation, chest discomfort or lower extremity swelling Gastrointestinal:  Denies nausea, heartburn, change in bowel habits, or hematochezia (+) diverticulitis  Skin: Denies abnormal skin rashes Lymphatics: Denies new lymphadenopathy or easy bruising Neurological:Denies numbness, tingling or new weaknesses Behavioral/Psych: Mood is stable, no new changes  All other systems were reviewed with the patient and are negative.  PHYSICAL EXAMINATION: ECOG PERFORMANCE STATUS: 0 - Asymptomatic  Vitals:   07/17/24 0831 07/17/24 0833  BP: (!) 165/92 (!) 160/98  Pulse: 78   Resp: 18   Temp: 98 F (36.7 C)   SpO2: 92%    Filed Weights   07/17/24 0831  Weight: 177 lb 14.4 oz (80.7 kg)    GENERAL:alert, no distress and comfortable SKIN:  no rashes or ecchymosis EYES: sclera clear NECK: without mass. No telangiectasia over the chest/upper back LYMPH:  no palpable cervical, supraclavicular, or axillary  lymphadenopathy LUNGS: clear with normal breathing effort HEART: regular rate & rhythm, no lower extremity edema, venous stasis changes, or hyperpigmentation over the lower legs ABDOMEN:abdomen soft, non-tender and normal bowel sounds. No hepatosplenomegaly Musculoskeletal:no cyanosis of digits and no clubbing  PSYCH: alert & oriented x 3 with fluent speech NEURO: no focal motor/sensory deficits  LABORATORY DATA:  I have reviewed the data as listed    Latest Ref Rng & Units 07/17/2024    8:07 AM 05/20/2024   11:23 AM 07/29/2017    3:24 AM  CBC  WBC 4.0 - 10.5 K/uL 6.6  5.8  12.2   Hemoglobin 12.0 - 15.0 g/dL 87.7  86.2  87.6   Hematocrit 36.0 - 46.0 % 36.9  40.9  37.7   Platelets 150 - 400 K/uL 148  128  153    Peripheral smear: Platelets appear normal in number.  Majority are small (which  argues against ITP or myeloproliferative disorder), no significant clumps.  White cell morphology is unremarkable.  Red cells: Few teardrops, ovalocytes, and targets.  No nucleated red cells.  Polychromasia not increased.  Moderate variation in red cell size.     Latest Ref Rng & Units 07/17/2024    8:07 AM 05/20/2024   11:23 AM 07/30/2017    3:00 AM  CMP  Glucose 70 - 99 mg/dL 871  896  770   BUN 8 - 23 mg/dL 6  7  17    Creatinine 0.44 - 1.00 mg/dL 9.27  9.35  9.07   Sodium 135 - 145 mmol/L 141  139  137   Potassium 3.5 - 5.1 mmol/L 4.4  4.0  4.6   Chloride 98 - 111 mmol/L 102  104  105   CO2 22 - 32 mmol/L 24  24  24    Calcium 8.9 - 10.3 mg/dL 89.9  9.6  9.2   Total Protein 6.5 - 8.1 g/dL 8.1  8.7    Total Bilirubin 0.0 - 1.2 mg/dL 0.9  1.6    Alkaline Phos 38 - 126 U/L 79  61    AST 15 - 41 U/L 52  49    ALT 0 - 44 U/L 33  33       RADIOGRAPHIC STUDIES: I have personally reviewed the radiological images as listed and agreed with the findings in the report. No results found.  ASSESSMENT & PLAN: 68 year old female   Thrombocytopenia  -We reviewed her medical record in detail with the  patient and her daughter.  -Platelet count intermittently low since at least 2011 - 2017 range 114 - 217K. -Plt dropped to 88K in 04/2016 during hospitalization for acute diverticulitis, plt normalized after recovery -Gap in her records from 2018 to 05/20/2024 where she was found to have recurrent low platelet count 128K during work up for tick bite, CBC otherwise normal.  -Candice Roy appears well, asymptomatic without bruising/bleeding. No constitutional symptoms. Reviewed common etiologies for thrombocytopenia - Platelets appear normal in number on the otherwise unremarkable peripheral smear.   - Although immature plt fraction is slightly elevated, small platelet size argues against ITP.  The smear is not consistent with a myeloproliferative disorder, and given normal Plt/WBC, a bone marrow condition is less likely -The intermittent thrombocytopenia is most likely secondary to alcohol use and possible liver disease such as cirrhosis. I encouraged her to cut back/quit. Family asking for help and referred to SW. -Will f/up pending B12, folate, hep serologies from today. No evidence of iron deficiency. Will add hemoglobin fractionation cascade  -Return for lab and follow up in 6 months. If stable will likely discharge to PCP -Pt seen with Dr. Cloretta  Substance use  -Counseled on smoking cessation, has not been able to quit on her own yet -Reviewed heavy alcohol use and health risks. Daughter requested SW referral for mental health and wellbeing of the family   Liver disease  -Initially diagnosed with NASH in 2014 -Abdominal imaging in 2017 shows nodular contour of the liver suggesting cirrhosis -If she does have cirrhosis and if she does have thrombocytopenia, cirrhosis could be culprit  -No clinical signs of chronic liver disease on physical exam -Will check Hep B/C and LFTs today -Recommend PCP to consider work up for cirrhosis   Social  -SDOH screen generated SW referral (food  insecurity)  Health maintenance  -Encouraged her to get UTD on age appropriate cancer screenings specifically mammogram. She understands the  recommendation     PLAN: -Medical record reviewed -Lab today, will f/up with results  -Most likely secondary to alcohol use and possible liver disease, encouraged to cut back/quit - referred to SW for resources -PCP can consider CT to confirm cirrhosis which would explain thrombocytopenia  -Pt seen with Dr. Cloretta  -Lab and f/up in 6 months, or sooner if needed   Orders Placed This Encounter  Procedures   CMP (Cancer Center only)    Standing Status:   Future    Number of Occurrences:   1    Expected Date:   07/17/2024    Expiration Date:   07/17/2025   Hgb Fractionation Cascade    Standing Status:   Future    Expiration Date:   07/17/2025   CBC with Differential (Cancer Center Only)    Standing Status:   Future    Expected Date:   01/17/2025    Expiration Date:   07/17/2025   Ambulatory referral to Social Work    Referral Priority:   Routine    Referral Type:   Consultation    Referral Reason:   Specialty Services Required    Number of Visits Requested:   1   Ambulatory referral to Social Work    Referral Priority:   Routine    Referral Type:   Consultation    Referral Reason:   Specialty Services Required    Number of Visits Requested:   1      All questions were answered. The patient knows to call the clinic with any problems, questions or concerns.      Candice Utsey K Tykerria Mccubbins, Candice Roy 07/17/24   This was a shared visit with Azalie Harbeck.  Candice Roy was interviewed and examined.  I reviewed the peripheral blood smear.  She is referred for evaluation of thrombocytopenia.  She has a history of intermittent mild thrombocytopenia.  The platelet count is in the low normal range today.  The differential diagnosis includes thrombocytopenia secondary to alcohol use, chronic liver disease, and chronic ITP.  We obtained additional laboratory  evaluation today.  I recommend considering further evaluation for cirrhosis.  I was present for greater than 50% of today's visit.  I performed medical decision making.  Arvella Cloretta, MD

## 2024-07-17 ENCOUNTER — Inpatient Hospital Stay: Attending: Nurse Practitioner | Admitting: Nurse Practitioner

## 2024-07-17 ENCOUNTER — Telehealth: Payer: Self-pay

## 2024-07-17 ENCOUNTER — Inpatient Hospital Stay

## 2024-07-17 ENCOUNTER — Telehealth: Payer: Self-pay | Admitting: Nurse Practitioner

## 2024-07-17 ENCOUNTER — Encounter: Payer: Self-pay | Admitting: Nurse Practitioner

## 2024-07-17 VITALS — BP 160/98 | HR 78 | Temp 98.0°F | Resp 18 | Ht 66.0 in | Wt 177.9 lb

## 2024-07-17 DIAGNOSIS — I10 Essential (primary) hypertension: Secondary | ICD-10-CM | POA: Diagnosis not present

## 2024-07-17 DIAGNOSIS — J449 Chronic obstructive pulmonary disease, unspecified: Secondary | ICD-10-CM | POA: Diagnosis not present

## 2024-07-17 DIAGNOSIS — Z87891 Personal history of nicotine dependence: Secondary | ICD-10-CM | POA: Diagnosis not present

## 2024-07-17 DIAGNOSIS — M549 Dorsalgia, unspecified: Secondary | ICD-10-CM | POA: Insufficient documentation

## 2024-07-17 DIAGNOSIS — G8929 Other chronic pain: Secondary | ICD-10-CM | POA: Diagnosis not present

## 2024-07-17 DIAGNOSIS — D696 Thrombocytopenia, unspecified: Secondary | ICD-10-CM | POA: Insufficient documentation

## 2024-07-17 LAB — IRON AND TIBC
Iron: 86 ug/dL (ref 28–170)
Saturation Ratios: 17 % (ref 10.4–31.8)
TIBC: 522 ug/dL — ABNORMAL HIGH (ref 250–450)
UIBC: 436 ug/dL

## 2024-07-17 LAB — FOLATE: Folate: 7.1 ng/mL (ref >5.9)

## 2024-07-17 LAB — HEPATITIS C ANTIBODY: HCV Ab: NONREACTIVE

## 2024-07-17 LAB — CMP (CANCER CENTER ONLY)
ALT: 33 U/L (ref 0–44)
AST: 52 U/L — ABNORMAL HIGH (ref 15–41)
Albumin: 4.5 g/dL (ref 3.5–5.0)
Alkaline Phosphatase: 79 U/L (ref 38–126)
Anion gap: 15 (ref 5–15)
BUN: 6 mg/dL — ABNORMAL LOW (ref 8–23)
CO2: 24 mmol/L (ref 22–32)
Calcium: 10 mg/dL (ref 8.9–10.3)
Chloride: 102 mmol/L (ref 98–111)
Creatinine: 0.72 mg/dL (ref 0.44–1.00)
GFR, Estimated: >60 mL/min (ref >60)
Glucose, Bld: 128 mg/dL — ABNORMAL HIGH (ref 70–99)
Potassium: 4.4 mmol/L (ref 3.5–5.1)
Sodium: 141 mmol/L (ref 135–145)
Total Bilirubin: 0.9 mg/dL (ref 0.0–1.2)
Total Protein: 8.1 g/dL (ref 6.5–8.1)

## 2024-07-17 LAB — CBC WITH DIFFERENTIAL (CANCER CENTER ONLY)
Abs Immature Granulocytes: 0.02 K/uL (ref 0.00–0.07)
Basophils Absolute: 0 K/uL (ref 0.0–0.1)
Basophils Relative: 1 %
Eosinophils Absolute: 0.3 K/uL (ref 0.0–0.5)
Eosinophils Relative: 5 %
HCT: 36.9 % (ref 36.0–46.0)
Hemoglobin: 12.2 g/dL (ref 12.0–15.0)
Immature Granulocytes: 0 %
Lymphocytes Relative: 27 %
Lymphs Abs: 1.8 K/uL (ref 0.7–4.0)
MCH: 32.3 pg (ref 26.0–34.0)
MCHC: 33.1 g/dL (ref 30.0–36.0)
MCV: 97.6 fL (ref 80.0–100.0)
Monocytes Absolute: 0.5 K/uL (ref 0.1–1.0)
Monocytes Relative: 7 %
Neutro Abs: 4 K/uL (ref 1.7–7.7)
Neutrophils Relative %: 60 %
Platelet Count: 148 K/uL — ABNORMAL LOW (ref 150–400)
RBC: 3.78 MIL/uL — ABNORMAL LOW (ref 3.87–5.11)
RDW: 16.9 % — ABNORMAL HIGH (ref 11.5–15.5)
WBC Count: 6.6 K/uL (ref 4.0–10.5)
nRBC: 0 % (ref 0.0–0.2)

## 2024-07-17 LAB — FERRITIN: Ferritin: 81 ng/mL (ref 11–307)

## 2024-07-17 LAB — IMMATURE PLATELET FRACTION: Immature Platelet Fraction: 10.2 % — ABNORMAL HIGH (ref 1.2–8.6)

## 2024-07-17 LAB — VITAMIN B12: Vitamin B-12: 201 pg/mL (ref 180–914)

## 2024-07-17 LAB — HEPATITIS B SURFACE ANTIGEN: Hepatitis B Surface Ag: NONREACTIVE

## 2024-07-17 LAB — SAVE SMEAR(SSMR), FOR PROVIDER SLIDE REVIEW

## 2024-07-17 NOTE — Telephone Encounter (Signed)
 Per pt's daughter, she will have patient give us  a call to schedule her fu per 07/17/24 LOS.

## 2024-07-17 NOTE — Telephone Encounter (Signed)
 Faxed over patient clinic consult note at [phone] to attend Dr. Lannie Purdue.

## 2024-07-20 LAB — HGB FRACTIONATION CASCADE
Hgb A2: 2 % (ref 1.8–3.2)
Hgb A: 98 % (ref 96.4–98.8)
Hgb F: 0 % (ref 0.0–2.0)
Hgb S: 0 %

## 2024-07-21 ENCOUNTER — Inpatient Hospital Stay

## 2024-07-21 NOTE — Telephone Encounter (Signed)
 Contacted pt to schedule an appt. Unable to reach via phone, voicemail was left.

## 2024-07-21 NOTE — Progress Notes (Signed)
 [facility] Clinical Social Work  Clinical Social Work was referred by medical provider for SDOH needs (alcohol usage, and support for family).  Clinical Social Worker contacted patient by phone to offer support and assess for needs. CSW explained purpose of call, patient was receptive to speaking with CSW. Patient confirmed daily consumption of wine, stating she will sip on a bottle each day. Patient stated wine use does not impact daily life, and declined any resources for alcohol consumption.  Patient stated the drinks have low percentages of alcohol, therefor is not concerned. Patient confirmed providers encouraged her to cut back on usage, stated alcohol usage is not related to health conditions.    CSW completed alcohol screening, patient's answer scored a 7, identifying no intervention needed.   Follow Up Plan:  No follow up scheduled. Patient understands if needs change, CSW is available.    Lizbeth Sprague, LCSW  Clinical Social Worker [facility]

## 2024-07-24 NOTE — Telephone Encounter (Signed)
 Patient has been scheduled for follow-up visit per 07/21/24 LOS.  LVM notifying pt of appt details, provided my direct number to pt if appt changes need to be made.

## 2024-07-25 ENCOUNTER — Ambulatory Visit: Payer: Self-pay | Admitting: Nurse Practitioner

## 2024-07-25 ENCOUNTER — Other Ambulatory Visit: Payer: Self-pay | Admitting: Nurse Practitioner

## 2024-07-25 DIAGNOSIS — D696 Thrombocytopenia, unspecified: Secondary | ICD-10-CM

## 2024-07-25 NOTE — Telephone Encounter (Signed)
-----   Message from Lacie K Burton sent at 07/25/2024  1:23 PM EDT ----- Camelia could you please let pt know results, no hepatitis B or C. Folic acid  is normal. She has a component of mild iron deficiency and low B12 which can cause low plt count. In addition to our  recommendations from the visit I suggest adding prenatal vitamin once daily (has iron and B12 in it).  Thanks Lacie NP  ----- Message ----- From: Interface, Lab In [place] Sent: 07/17/2024   8:22 AM EDT To: Lacie K Burton, NP

## 2024-07-25 NOTE — Telephone Encounter (Signed)
 Per NP, labs show no hepatitis B or C. Folic acid  is normal. She has a component of mild iron deficiency and low B12 which can cause low plt count. In addition to our recommendations from the visit I suggest adding prenatal vitamin once daily (has iron and B12 in it). Candice Roy made aware and agrees to start the B12/MVI w/iron

## 2025-01-22 ENCOUNTER — Ambulatory Visit: Admitting: Nurse Practitioner

## 2025-01-22 ENCOUNTER — Inpatient Hospital Stay

## 2025-01-22 ENCOUNTER — Inpatient Hospital Stay: Admitting: Oncology

## 2025-01-22 ENCOUNTER — Other Ambulatory Visit
